# Patient Record
Sex: Male | Born: 1955
Health system: Southern US, Community
[De-identification: ages and names within clinical notes are randomized; demographics above are authoritative.]

## PROBLEM LIST (undated history)

## (undated) DIAGNOSIS — K219 Gastro-esophageal reflux disease without esophagitis: Secondary | ICD-10-CM

## (undated) DIAGNOSIS — I10 Essential (primary) hypertension: Secondary | ICD-10-CM

## (undated) DIAGNOSIS — J439 Emphysema, unspecified: Secondary | ICD-10-CM

## (undated) DIAGNOSIS — R0602 Shortness of breath: Secondary | ICD-10-CM

## (undated) HISTORY — PX: TOOTH EXTRACTION: SUR596

## (undated) HISTORY — DX: Gastro-esophageal reflux disease without esophagitis: K21.9

## (undated) HISTORY — DX: Emphysema, unspecified: J43.9

---

## 1999-04-18 HISTORY — PX: INGUINAL HERNIA REPAIR: SUR1180

## 2005-04-08 ENCOUNTER — Encounter: Admission: RE | Admit: 2005-04-08 | Discharge: 2005-04-08 | Payer: Self-pay | Admitting: General Surgery

## 2005-04-09 ENCOUNTER — Ambulatory Visit (HOSPITAL_COMMUNITY): Admission: RE | Admit: 2005-04-09 | Discharge: 2005-04-09 | Payer: Self-pay | Admitting: General Surgery

## 2005-04-09 ENCOUNTER — Ambulatory Visit (HOSPITAL_BASED_OUTPATIENT_CLINIC_OR_DEPARTMENT_OTHER): Admission: RE | Admit: 2005-04-09 | Discharge: 2005-04-09 | Payer: Self-pay | Admitting: General Surgery

## 2006-09-19 ENCOUNTER — Emergency Department (HOSPITAL_COMMUNITY): Admission: EM | Admit: 2006-09-19 | Discharge: 2006-09-19 | Payer: Self-pay | Admitting: Emergency Medicine

## 2007-06-06 ENCOUNTER — Ambulatory Visit: Payer: Self-pay | Admitting: Critical Care Medicine

## 2007-06-06 ENCOUNTER — Inpatient Hospital Stay (HOSPITAL_COMMUNITY): Admission: EM | Admit: 2007-06-06 | Discharge: 2007-06-09 | Payer: Self-pay | Admitting: Emergency Medicine

## 2007-06-08 ENCOUNTER — Encounter: Payer: Self-pay | Admitting: Pulmonary Disease

## 2007-06-26 ENCOUNTER — Emergency Department (HOSPITAL_COMMUNITY): Admission: EM | Admit: 2007-06-26 | Discharge: 2007-06-26 | Payer: Self-pay | Admitting: Family Medicine

## 2007-07-20 ENCOUNTER — Ambulatory Visit: Payer: Self-pay | Admitting: Pulmonary Disease

## 2007-08-23 DIAGNOSIS — J45909 Unspecified asthma, uncomplicated: Secondary | ICD-10-CM | POA: Insufficient documentation

## 2007-08-23 DIAGNOSIS — R05 Cough: Secondary | ICD-10-CM | POA: Insufficient documentation

## 2007-08-23 DIAGNOSIS — K219 Gastro-esophageal reflux disease without esophagitis: Secondary | ICD-10-CM

## 2007-08-23 DIAGNOSIS — R0602 Shortness of breath: Secondary | ICD-10-CM | POA: Insufficient documentation

## 2007-08-23 DIAGNOSIS — J209 Acute bronchitis, unspecified: Secondary | ICD-10-CM

## 2007-08-23 DIAGNOSIS — R059 Cough, unspecified: Secondary | ICD-10-CM | POA: Insufficient documentation

## 2007-10-25 ENCOUNTER — Ambulatory Visit: Payer: Self-pay | Admitting: Pulmonary Disease

## 2007-10-25 DIAGNOSIS — J449 Chronic obstructive pulmonary disease, unspecified: Secondary | ICD-10-CM

## 2007-10-25 DIAGNOSIS — R599 Enlarged lymph nodes, unspecified: Secondary | ICD-10-CM | POA: Insufficient documentation

## 2007-11-24 ENCOUNTER — Ambulatory Visit: Payer: Self-pay | Admitting: Cardiovascular Disease

## 2008-04-26 ENCOUNTER — Ambulatory Visit: Payer: Self-pay | Admitting: Pulmonary Disease

## 2009-04-26 ENCOUNTER — Ambulatory Visit: Payer: Self-pay | Admitting: Pulmonary Disease

## 2010-04-24 ENCOUNTER — Ambulatory Visit: Payer: Self-pay | Admitting: Pulmonary Disease

## 2010-04-24 DIAGNOSIS — R0609 Other forms of dyspnea: Secondary | ICD-10-CM

## 2010-04-24 DIAGNOSIS — R0989 Other specified symptoms and signs involving the circulatory and respiratory systems: Secondary | ICD-10-CM | POA: Insufficient documentation

## 2010-09-18 NOTE — Assessment & Plan Note (Signed)
Summary: rov for copd   Primary Provider/Referring Provider:  Silvestre Moment  CC:  1 year follow up on COPD, Breathing is doing good, SOB with extreme activity but not any worse than usual, and Pt has no complaints today. Pt would like to discuss about having a sleep study done.  History of Present Illness: the pt comes in today for f/u of his known copd.  He has been compliant with meds, and reports no breathing issues since his last visit.  He is staying active, and has had no cough, congestion, or mucus.  He is asking today about snoring, and possibly having a sleep study.  He denies nonrestorative sleep, and sleep pressure during the day with periods of inactivity.  Preventive Screening-Counseling & Management  Alcohol-Tobacco     Smoking Status: quit     Packs/Day: 1.0     Year Started: 1964     Year Quit: 2008  Current Medications (verified): 1)  Fish Oil .... (Unknown Dosage) Take 1 Tablet By Mouth Once A Day 2)  Multivitamins   Caps (Multiple Vitamin) .... Take 1 Tablet By Mouth Once A Day 3)  Bayer Low Strength 81 Mg  Tbec (Aspirin) .... Take 1 Tablet By Mouth Once A Day 4)  Advair Diskus 250-50 Mcg/dose  Misc (Fluticasone-Salmeterol) .... Inhale 1 Puff Two Times A Day 5)  Nexium 40 Mg  Cpdr (Esomeprazole Magnesium) .... Take 1 Tablet By Mouth Once A Day 6)  Proair Hfa 108 (90 Base) Mcg/act  Aers (Albuterol Sulfate) .... 2 Puffs Every 4-6 Hours As Needed 7)  Lisinopril 20 Mg Tabs (Lisinopril) .... One Tab Once Daily  Allergies (verified): No Known Drug Allergies  Social History: Patient states former smoker.  Started 1964. qut 2008 1 ppdPacks/Day:  1.0  Review of Systems       The patient complains of shortness of breath with activity and weight change.  The patient denies shortness of breath at rest, productive cough, non-productive cough, coughing up blood, chest pain, irregular heartbeats, acid heartburn, indigestion, loss of appetite, abdominal pain, difficulty  swallowing, sore throat, tooth/dental problems, headaches, nasal congestion/difficulty breathing through nose, sneezing, itching, ear ache, anxiety, depression, hand/feet swelling, joint stiffness or pain, rash, change in color of mucus, and fever.    Vital Signs:  Patient profile:   55 year old male Height:      68 inches Weight:      197.38 pounds BMI:     30.12 O2 Sat:      97 % on Room air Temp:     98.7 degrees F oral Pulse rate:   76 / minute BP sitting:   132 / 82  (right arm) Cuff size:   regular  Vitals Entered By: Carver Fila (April 24, 2010 8:56 AM)  O2 Flow:  Room air CC: 1 year follow up on COPD, Breathing is doing good, SOB with extreme activity but not any worse than usual, Pt has no complaints today. Pt would like to discuss about having a sleep study done Comments meds and allergies updated Phone number updated Mindy Edward Jolly  April 24, 2010 8:53 AM 1    Physical Exam  General:  wd male in nad Nose:  narrowed bilat, but not obstructed Lungs:  totally clear to auscultation Heart:  rrr, no mrg Extremities:  no edema or cyanosis  Neurologic:  alert and oriented, moves all 4.   Impression & Recommendations:  Problem # 1:  COPD (ICD-496) the pt is doing  well on his current bronchodilator regimen, and is satisfied with his exertional tolerance.  I have asked him to continue with current meds.  Problem # 2:  SNORING (ICD-786.09) the pt denies nonrestorative sleep and daytime sleep pressure.  I have asked him to work on weight loss and positional therapy  Medications Added to Medication List This Visit: 1)  Lisinopril 20 Mg Tabs (Lisinopril) .... One tab once daily  Other Orders: Est. Patient Level III (16109)  Patient Instructions: 1)  no change in pulmonary medications 2)  work on weight loss and staying off back while sleeping 3)  if you are not sleeping well, or if you are noticing fatigue and sleepiness, let me know. 4)  followup with me in one  year.   Prescriptions: PROAIR HFA 108 (90 BASE) MCG/ACT  AERS (ALBUTEROL SULFATE) 2 puffs every 4-6 hours as needed  #1 x 11   Entered and Authorized by:   Barbaraann Share MD   Signed by:   Barbaraann Share MD on 04/24/2010   Method used:   Print then Give to Patient   RxID:   [id number] NEXIUM 40 MG  CPDR (ESOMEPRAZOLE MAGNESIUM) Take 1 tablet by mouth once a day  #30 x 11   Entered and Authorized by:   Barbaraann Share MD   Signed by:   Barbaraann Share MD on 04/24/2010   Method used:   Print then Give to Patient   RxID:   [id number] ADVAIR DISKUS 250-50 MCG/DOSE  MISC (FLUTICASONE-SALMETEROL) Inhale 1 puff two times a day  #1 x 11   Entered and Authorized by:   Barbaraann Share MD   Signed by:   Barbaraann Share MD on 04/24/2010   Method used:   Print then Give to Patient   RxID:   [id number]    Immunization History:  Influenza Immunization History:    Influenza:  historical (05/17/2009)  Pneumovax Immunization History:    Pneumovax:  historical (05/17/2008)

## 2010-09-18 NOTE — Consult Note (Signed)
Summary: [facility]   Imported By: Sherian Rein 04/24/2010 11:49:43  _____________________________________________________________________  External Attachment:    Type:   Image     Comment:   External Document

## 2010-12-30 NOTE — H&P (Signed)
[name], SCHROEPFER NO.:  [id number]   MEDICAL RECORD NO.:  [id number]          PATIENT TYPE:  INP   LOCATION:  2110                         FACILITY:  [facility]   PHYSICIAN:  Michiel Cowboy, MDDATE OF BIRTH:  09-03-1955   DATE OF ADMISSION:  06/06/2007  DATE OF DISCHARGE:                              HISTORY & PHYSICAL   PRIMARY CARE PHYSICIAN:  Dr. Lenise Arena.   CHIEF COMPLAINT:  Severe shortness of breath.   HISTORY OF PRESENT ILLNESS:  This is a 55 year old gentleman with no  known past medical history, who for the past one week has been having  severe shortness of breath.   About one week ago, the patient noted that with exertion, he felt some  shortness of breath.  The patient went to see his primary care  physician, who ordered a chest x-ray, which was normal.  Thereafter, the  patient continued to have shortness of breath, which progressed to where  he could barely walk across the room without getting severely short of  breath.  This had been happening for the past 1 to 2 weeks.  Of note, he  had an airplane ride about 1 month ago, but denies any swelling of his  lower extremities.  He does report now that he is having chest pain on  occasional deep inspiration in the front of his chest that has been  going on for the past few days.  The patient today was again evaluated  at urgent care for severe shortness of breath, but they noted that he  was very hypoxic, saturating low 80s on room air.  The patient was  placed on oxygen, given Decadron IM, and sent to [facility] Emergency  Department.  A chest x-ray at that time showed vague questionable right  lower lobe infiltrate, which by further evaluation of radiology at [facility] was not confirmed.   ALLERGIES:  No known drug allergies.   MEDICATIONS:  Prevacid.   PAST MEDICAL HISTORY:  Only significant for remote questionable asthma  as a very young child.   SOCIAL HISTORY:  The patient is married.   He has a 30-pack-year smoking  history.  Past alcohol abuse; not currently.  Absolutely denies any  drugs.  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  Does not own pets.  Does not own  birds.  Has no exposure to aerosolized chemicals per him.   FAMILY HISTORY:  Significant for coronary artery disease in father.  No  early MIs.  No lung disease.   REVIEW OF SYSTEMS:  No fevers, no chills, no night sweats, no weight  loss, no headache.  No upper respiratory abnormalities.  The patient  does report severe shortness of breath and dyspnea on exertion.  No PND  or edema.  No palpitations or syncope.  No urinary troubles.  No nausea,  vomiting or diarrhea.  Otherwise, review of systems is negative.   PHYSICAL EXAMINATION:  VITAL SIGNS:  Temperature 98.4, pulse 94,  respirations 22, blood pressure 137/90, sats 92% on 5 liters.  GENERAL:  The patient appears to be in  no acute distress, breathing  regularly, but somewhat anxious.  HEENT:  PERRLA/EOMI and moist mucous membranes.  NECK:  No lymphadenopathy noted.  HEART:  Rapid, but regular.  No murmurs noted.  LUNGS:  Wheezing bilaterally.  No crackles.  SKIN:  No rash.  ABDOMEN:  Soft, nontender, nondistended.  EXTREMITIES:  No clubbing, cyanosis or edema.  Joints are within normal  limits.  NEUROLOGICAL:  Nonfocal.   RADIOLOGICAL STUDIES:  Shows chest x-ray with right lower lobe pneumonia  per outside read, but not confirmed by the read of [facility].  I have personally reviewed the x-ray, as well as had a radiologist read  it here.   LABORATORY DATA:  White blood cell count elevated to 15.1, 93%  neutrophils, 1% eosinophils, only 4% lymphocytes.  H&H 17.1 and 49.8,  platelets 210.  Sodium 138, bicarb 25, potassium 4.4, creatinine 0.8.  Gas ABG 7.426, 34 and 60, sats 91%, and that was on I believe 5 liters.   ASSESSMENT AND PLAN:  This is a 55 year old gentleman with very severe  hypoxia.  Etiology includes potential chronic obstructive  pulmonary  disease versus asthma versus pulmonary embolism.  1. Chronic obstructive pulmonary disease versus asthma versus reactive      airway disease:  We will give the patient Solu-Medrol intravenous.      Start him on Advair, Atrovent and albuterol nebulizers q.2 hours,      as well as scheduled Atrovent nebulizers q.8 hours.  We will given      him antibiotics, Avelox, and obtain alpha-1-antitrypsin levels, as      well as Human immunodeficiency virus.  We will obtain CAT scan with      contrast to rule out pulmonary embolism, as well as to assess      parenchyma, given the degree of hypoxia and 30-pack-year smoking      history, which may now be severe enough to explain the degree of      hypoxia.  We will place the patient in step-down and watch      carefully.  The patient does not have risk factors for Human      immunodeficiency virus.  We will consider pulmonary consult in the      morning if no improvement.  2. Chest pain, pleuritic in nature:  We will rule out for pulmonary      embolism, but also given his severe shortness of breath, we will      cycle cardiac enzymes and obtain EKG now and in the morning.  3. Prophylaxis:  Lovenox and Protonix.      Michiel Cowboy, MD  Electronically Signed     AVD/MEDQ  D:  06/06/2007  T:  06/07/2007  Job:  [id number]   cc:   Joycelyn Rua, M.D.

## 2010-12-30 NOTE — Discharge Summary (Signed)
[name], [name]                ACCOUNT NO.:  [id number]   MEDICAL RECORD NO.:  [id number]          PATIENT TYPE:  INP   LOCATION:  5506                         FACILITY:  [facility]   PHYSICIAN:  Michelene Gardener, MD    DATE OF BIRTH:  Dec 30, 1955   DATE OF ADMISSION:  06/06/2007  DATE OF DISCHARGE:  06/09/2007                               DISCHARGE SUMMARY   PRIMARY CARE PHYSICIAN:  Dr. Joycelyn Rua.   DISCHARGE DIAGNOSES:  1. Chronic obstructive pulmonary disease.  2. Hypoxemia.  3. Gastroesophageal reflux disease.  4. Leukocytosis.  5. Tobacco abuse.   DISCHARGE MEDICATIONS:  1. Albuterol 2 puffs q.4 h. as needed.  2. Advair 250/50 1 puff twice daily.  3. Avelox 400 mg p.o. once daily x5 days.  4. Prednisone tapering dose.  5. Prevacid 30 mg once a day.  6. Multivitamin one tab p.o. once daily.  7. Fish oil once a day.   CONSULTATIONS:  Pulmonary consult done on October 21.   PROCEDURES:  Pulmonary function test.  Showed evidence of his COPD.   RADIOLOGY STUDIES:  CT scan of the chest showed diffuse peribronchial  thickening compatible with bronchitis with underlying emphysematous  changes.  There is no evidence of pulmonary embolism.  There is a single  nonspecific minimally prominent right hilar lymph node.   COURSE OF HOSPITALIZATION:  This is a 55 year old male who is a heavy  smoker presented to the hospital on October 20 complaining of increasing  shortness of breath.  The patient was admitted to the hospital for  evaluation of his possible COPD.  Was started on albuterol nebulizer,  Atrovent, and Proventil nebulizers, and was put on Advair.  He was also  given Avelox IV in addition to Solu-Medrol IV.  The patient has been on  oxygen during the hospitalization as well.  Pulmonary evaluation was  done, and the patient was taken for pulmonary function test that showed  evidence of COPD.  The patient improved quick.  At the time of discharge  he was given  prednisone in tapering dose and antibiotics to be continued  for 5 more days.  He also was advised to take albuterol and Advair as  instructed.  The patient was advised to follow with [facility] in  three months for possible repeat of his  CAT scan.  He was also advised to quit smoking and to follow with his  primary physician next week.  Otherwise, other medical conditions  remained stable during the hospitalization.   Assessment time is 40 minutes.      Michelene Gardener, MD  Electronically Signed     NAE/MEDQ  D:  06/09/2007  T:  06/10/2007  Job:  [id number]   cc:   Joycelyn Rua, M.D.

## 2010-12-30 NOTE — Consult Note (Signed)
NAMELYSLE, YERO NO.:  [id number]   MEDICAL RECORD NO.:  [id number]          PATIENT TYPE:  INP   LOCATION:  2110                         FACILITY:  [facility]   PHYSICIAN:  Felipa Evener, MD  DATE OF BIRTH:  07-May-1956   DATE OF CONSULTATION:  DATE OF DISCHARGE:                                 CONSULTATION   IDENTIFYING DATA:  Patient is a 50-year male smoker with 35-pack year  history of smoking, presenting to the hospital with shortness of breath.  Patient reports the shortness of breath started approximately in January  2008, 9 months prior to presentation, for which he reported to urgent  care and was diagnosed with pneumonia, given amoxicillin.  At the time,  patient was not admitted to the hospital.  He felt better approximately  2 after amoxicillin was started and he was given also p.r.n. Albuterol  and sent home.  Patient felt well for approximately 8 months; however,  was noted back to baseline until 5 days ago, when he was barbecuing and  started noting a significant amount of shortness of breath.  The  shortness of breath continued to increase until patient brought to the  hospital on June 06, 2007 with increased dyspnea and exertion.  He  reports some cough in the morning that is productive of off white to  gray sputum with no evidence of purulence.  No fever, chills, nausea,  abdominal pain, or chest pain, no headaches, visual changes, any rashes.  He denied any leg edema.  Also denies any hemoptysis, any weight loss,  or weight gain.  He denies any occupational or environmental exposure  prior to the onset of events; however, he did report that he has had  pneumonia and asthma problems as a child as well as frequent colds for  the past few years.   PAST MEDICAL HISTORY:  Significant for history of asthma as a young  child that has been resolved and gastroesophageal reflux disease.   HOME MEDICATIONS:  1. Fish oil.  2. Prevacid.  3.  MVI.  4. Baby aspirin.   FAMILY HISTORY:  Significant for coronary artery disease.  He new of no  history of cystic fibrosis or any early pulmonary disease even in  smokers.   SOCIAL HISTORY:  He smokes 1 pack per day for 35 years.  No alcohol and  drug abuse.  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   REVIEW OF SYSTEMS:  A 12-point review of system was performed.  It was  negative for other than mentioned in the history of present illness.   PHYSICAL EXAMINATION:  GENERAL:  This is a well-appearing male, sitting  up in his bed in no acute distress.  Awake, alert and oriented x3.  VITAL SIGNS:  Temperature is 98.3, heart rate 90-100, respiratory rate  10-20, blood pressure is 106/46 to 152/94 earlier. O2 saturation is 93%  with 3 liters nasal cannula.  HEENT:  Normocephalic, atraumatic.  Pupils equal, round and reactive to  light.  Extraocular  movements intact.  Oral mucosa is within normal  limits.  NECK:  No thyromegaly, lymphadenopathy.  No JVD appreciated.  HEART:  Regular rate and rhythm.  Normal S1 and S2.  No murmurs, rubs or  gallops appreciated.  LUNGS:  With diffuse expiratory wheezes in all lung fields.  Bibasilar  crackles.  ABDOMEN:  Soft, nontender, nondistended.  Positive bowel sounds.  EXTREMITIES:  No edema and no clubbing appreciated.  NEUROLOGIC:  Grossly intact.   LABORATORY STUDIES:  Significant for sodium of 139, potassium 3.9,  chloride 105, bicarb 23, BUN 14, creatinine 0.94, blood sugar 203,  calcium 95, LFTs are normal on admission.  Cardiac enzymes are negative.  White blood cell count 13.3, down from 15.1 on admission.  Hemoglobin  15.9, down from 17.7 on admission, platelets 215.  Coags are normal.  ABG with pH of 7.46, CO2 35, O2 62, and bicarb of 25.  CT angiogram was  done which revealed no significant PE.  There is diffuse peribronchial  cuffing consistent with  bronchitis and bronchiectasis.  Mild  emphysematous changes with no masses.  There is right hilar  lymphadenopathy with mild aneurysmal dilatation of the ascending  thoracic aorta with no evidence of dissection.  Chest x-ray revealed  mild bronchiectatic changes that appeared to be chronic.   ASSESSMENT/PLAN:  Patient is a 55 year old male with past medical  history of smoking and COPD with chronic bronchitis and evidence of  bronchiectasis.  There is also some right-sided right hilar  lymphadenopathy evident with no pulmonary mass.  Therefore, we recommend  starting Advair 250/50 one puff b.i.d.  Will start with Combivent 2  puffs q.i.d. while awake, p.r.n. albuterol as ordered.  We will order  __________ desaturate to assess the patient's need for oxygen.  We will  order full pulmonary function test with pre- and post to evaluate the  extent of the patient's pulmonary disease.  Smoking cessation was done  by me.  We will order Avelox as the appropriate antibiotic at this time,  60 mg p.o. daily for 7 days.  I would recommend a CT of the chest with  contrast in 3 months to evaluate with the lymphadenopathy.  Patient will  definitely need an outpatient pulmonary appointment for followup.  Thank  you for allowing me to participate in this patient's care.      Felipa Evener, MD  Electronically Signed     WJY/MEDQ  D:  06/07/2007  T:  06/08/2007  Job:  [id number]

## 2011-01-02 NOTE — Op Note (Signed)
NAMEBURLEIGH, BROCKMANN                ACCOUNT NO.:  [id number]   MEDICAL RECORD NO.:  [id number]          PATIENT TYPE:  AMB   LOCATION:  DSC                          FACILITY:  [facility]   PHYSICIAN:  Cherylynn Ridges, M.D.    DATE OF BIRTH:  1955/09/21   DATE OF PROCEDURE:  04/09/2005  DATE OF DISCHARGE:                                 OPERATIVE REPORT   PREOPERATIVE DIAGNOSIS:  Right inguinal hernia.   POSTOPERATIVE DIAGNOSIS:  Large indirect right inguinal hernia   PROCEDURE:  Right inguinal hernia repair with mesh.   SURGEON:  Cherylynn Ridges, M.D.   ANESTHESIA:  General endotracheal.   ESTIMATED BLOOD LOSS:  Less than 30 cc.   COMPLICATIONS:  None.   CONDITION:  Stable   INDICATIONS FOR OPERATION:  The patient is a 55 year old with a symptomatic  right inguinal hernia who comes in now for repair.   FINDINGS:  The patient had a large indirect hernia with entrapped omental or  fatty tissue which were able to reduce into the peritoneal cavity.  The  omentum was stuck in the sac and had to be dissect away from the wall prior  to closure.   DESCRIPTION OF OPERATION:  The patient was taken to the operating room,  placed on the table in supine position. After an adequate general laryngeal  airway anesthetic was administered, he was  prepped and draped in the usual  sterile manner exposing the right lower quadrant.   A right lower quadrant transverse curvilinear incision was made at the level  of superficial ring was taken down through the subcutaneous tissue using  electrocautery down to the external oblique fascia. We were able to split  the fascia along its fibers through the superficial ring and then  subsequently dissect out the spermatic cord with the a large hernia sac with  it that the pubic tubercle.  We placed a Penrose drain around the hernia sac  and then we were able to dissect away the large hernia sac from the  spermatic cord with minimal difficulty.  There was some  cremasteric muscle  attached to the sac along with lipoma the structures, however, once we  opened the site we could see that it contained a large amount of omentum  which we were able to dissect away from the wall, pass back into the  peritoneal cavity and subsequently ligate the sac at its neck at the  internal ring after choosing and using 2-0 Ethibond suture ligatures.  Once  this was done, we resected the excess sac and allowed the sac to retract  underneath the abdominal wall muscles.  We then reinforced the floor using a  right running O Prolene suture x2 attached to the Marlex mesh measuring 5 x  3 cm incised, attaching it anterior medially in a  continuous running  fashion to the conjoined tendon and inferolaterally to the reflected portion  of inguinal ligament.  We irrigated with it and soaked in antibiotic  solution.  Once this was closed and up to recreating the internal ring, we  closed the external  oblique fascia on top of the cord using a running 3-0  Vicryl suture.  Once this was done, Scarpa's fascia was reapproximated using  three interrupted stitches of 3-0 Vicryl, and then the skin was closed using  a running subcuticular stitch of 4-0 Monocryl.  We injected with 0.5%  Marcaine without epinephrine prior to closure.  All needle, sponge counts  and instrument counts were correct. A sterile dressing was applied.      Cherylynn Ridges, M.D.  Electronically Signed     JOW/MEDQ  D:  04/09/2005  T:  04/10/2005  Job:  [id number]

## 2011-05-04 ENCOUNTER — Ambulatory Visit: Payer: Self-pay | Admitting: Pulmonary Disease

## 2011-05-13 ENCOUNTER — Encounter: Payer: Self-pay | Admitting: Pulmonary Disease

## 2011-05-18 ENCOUNTER — Ambulatory Visit (INDEPENDENT_AMBULATORY_CARE_PROVIDER_SITE_OTHER): Payer: BC Managed Care – PPO | Admitting: Pulmonary Disease

## 2011-05-18 ENCOUNTER — Encounter: Payer: Self-pay | Admitting: Pulmonary Disease

## 2011-05-18 VITALS — BP 112/70 | HR 88 | Temp 98.6°F | Ht 68.0 in | Wt 203.0 lb

## 2011-05-18 DIAGNOSIS — J449 Chronic obstructive pulmonary disease, unspecified: Secondary | ICD-10-CM

## 2011-05-18 DIAGNOSIS — Z23 Encounter for immunization: Secondary | ICD-10-CM

## 2011-05-18 NOTE — Patient Instructions (Signed)
Continue on current meds. Stay active, work on weight loss followup with me in one year, but call if having breathing issues.

## 2011-05-18 NOTE — Assessment & Plan Note (Signed)
The patient is doing fairly well from a COPD standpoint.  He has been compliant on his medications, and is staying active.  He has not had an acute exacerbation or required excessive rescue inhaler use.  I've asked him to continue with his current meds, and to followup with me in one year.  We will give him the flu shot today.

## 2011-05-18 NOTE — Progress Notes (Signed)
  Subjective:    Patient ID: [name], male    DOB: 1956/05/10, 55 y.o.   MRN: [id number]  HPI The patient comes in today for followup of his known COPD.  He is been compliant with his bronchodilator regimen, and feels that he is doing very well.  He is staying active, and has not had an acute exacerbation or need to use his rescue inhaler frequently.  He denies any significant cough, chest congestion, or purulent mucus.   Review of Systems  Constitutional: Negative for fever and unexpected weight change.  HENT: Negative for ear pain, nosebleeds, congestion, sore throat, rhinorrhea, sneezing, trouble swallowing, dental problem, postnasal drip and sinus pressure.   Eyes: Negative for redness and itching.  Respiratory: Negative for cough, chest tightness, shortness of breath and wheezing.   Cardiovascular: Negative for palpitations and leg swelling.  Gastrointestinal: Negative for nausea and vomiting.  Genitourinary: Negative for dysuria.  Musculoskeletal: Negative for joint swelling.  Skin: Negative for rash.  Neurological: Negative for headaches.  Hematological: Does not bruise/bleed easily.  Psychiatric/Behavioral: Negative for dysphoric mood. The patient is not nervous/anxious.        Objective:   Physical Exam Well-developed male in no acute distress Nose without purulence or discharge noted Chest totally clear to auscultation Cardiac with regular rate and rhythm, no MRG Lower extremities without edema, no cyanosis noted Alert and oriented, moves all 4 extremities.       Assessment & Plan:

## 2011-05-18 NOTE — Progress Notes (Signed)
Addended by: Salli Quarry on: 05/18/2011 11:09 AM   Modules accepted: Orders

## 2011-05-27 LAB — CARDIAC PANEL(CRET KIN+CKTOT+MB+TROPI)
Relative Index: INVALID
Relative Index: INVALID
Relative Index: INVALID
Total CK: 37
Troponin I: 0.01

## 2011-05-27 LAB — POCT I-STAT 3, ART BLOOD GAS (G3+)
Bicarbonate: 22.4
O2 Saturation: 91
Operator id: 277341
Patient temperature: 37
TCO2: 23
pCO2 arterial: 35.3
pH, Arterial: 7.426
pH, Arterial: 7.456 — ABNORMAL HIGH

## 2011-05-27 LAB — COMPREHENSIVE METABOLIC PANEL
ALT: 19
AST: 12
Alkaline Phosphatase: 56
CO2: 25
CO2: 26
Chloride: 105
Creatinine, Ser: 0.8
GFR calc Af Amer: >60
GFR calc Af Amer: >60
GFR calc non Af Amer: >60
Potassium: 4.1
Potassium: 4.2
Sodium: 137
Total Bilirubin: 0.6

## 2011-05-27 LAB — CBC
Hemoglobin: 15.9
MCHC: 34.2
MCV: 93.7
Platelets: 196
Platelets: 210
Platelets: 215
RBC: 4.82
RBC: 4.97
RBC: 5.38
RDW: 12.3
RDW: 12.4
WBC: 18.4 — ABNORMAL HIGH

## 2011-05-27 LAB — CK TOTAL AND CKMB (NOT AT ARMC)
Relative Index: INVALID
Total CK: 44

## 2011-05-27 LAB — I-STAT 8, (EC8 V) (CONVERTED LAB)
Bicarbonate: 24.5 — ABNORMAL HIGH
Chloride: 106
Glucose, Bld: 119 — ABNORMAL HIGH
HCT: 52
Sodium: 138

## 2011-05-27 LAB — BASIC METABOLIC PANEL
CO2: 23
Chloride: 105
Creatinine, Ser: 0.94
GFR calc Af Amer: >60
Glucose, Bld: 203 — ABNORMAL HIGH
Sodium: 139

## 2011-05-27 LAB — HIV ANTIBODY (ROUTINE TESTING W REFLEX): HIV: NONREACTIVE

## 2011-05-27 LAB — PROTIME-INR: INR: 0.9

## 2011-05-27 LAB — POCT I-STAT CREATININE: Operator id: 151321

## 2011-05-27 LAB — DIFFERENTIAL
Eosinophils Relative: 1
Monocytes Relative: 2 — ABNORMAL LOW
Neutro Abs: 14.1 — ABNORMAL HIGH
Neutrophils Relative %: 93 — ABNORMAL HIGH

## 2011-05-27 LAB — ALPHA-1-ANTITRYPSIN: A-1 Antitrypsin, Ser: 156

## 2011-05-28 ENCOUNTER — Other Ambulatory Visit: Payer: Self-pay | Admitting: Pulmonary Disease

## 2012-05-23 ENCOUNTER — Ambulatory Visit (INDEPENDENT_AMBULATORY_CARE_PROVIDER_SITE_OTHER): Payer: BC Managed Care – PPO | Admitting: Pulmonary Disease

## 2012-05-23 ENCOUNTER — Encounter: Payer: Self-pay | Admitting: Pulmonary Disease

## 2012-05-23 VITALS — BP 138/98 | HR 88 | Temp 98.6°F | Ht 68.0 in | Wt 206.6 lb

## 2012-05-23 DIAGNOSIS — Z23 Encounter for immunization: Secondary | ICD-10-CM

## 2012-05-23 DIAGNOSIS — J449 Chronic obstructive pulmonary disease, unspecified: Secondary | ICD-10-CM

## 2012-05-23 NOTE — Progress Notes (Signed)
  Subjective:    Patient ID: [name], male    DOB: [birth year], 56 y.o.   MRN: [id number]  HPI Patient comes in today for followup of his known COPD, secondary to asthma and emphysema.  He has done very well on his current regimen, and has not had a pulmonary infection or acute exacerbation since the last visit.  He rarely uses his rescue inhaler.   Review of Systems  Constitutional: Negative for fever and unexpected weight change.  HENT: Positive for rhinorrhea. Negative for ear pain, nosebleeds, congestion, sore throat, sneezing, trouble swallowing, dental problem, postnasal drip and sinus pressure.   Eyes: Negative for redness and itching.  Respiratory: Negative for cough, chest tightness, shortness of breath and wheezing.   Cardiovascular: Negative for palpitations and leg swelling.  Gastrointestinal: Negative for nausea and vomiting.  Genitourinary: Negative for dysuria.  Musculoskeletal: Negative for joint swelling.  Skin: Negative for rash.  Neurological: Negative for headaches.  Hematological: Does not bruise/bleed easily.  Psychiatric/Behavioral: Negative for dysphoric mood. The patient is not nervous/anxious.        Objective:   Physical Exam Overweight male in no acute distress Nose without purulence or discharge noted Neck without lymphadenopathy or thyromegaly Chest with totally clear breath sounds, no wheezing Cardiac exam with regular rate and rhythm Lower extremities without edema, no cyanosis Alert and oriented, moves all 4 extremities.       Assessment & Plan:

## 2012-05-23 NOTE — Assessment & Plan Note (Signed)
The patient is doing well from a COPD standpoint on his current regimen.  I've asked him to stay on his bronchodilator regimen, and also to work on weight loss and some type of exercise program.  Maryclare Labrador give him the flu shot today.

## 2012-05-23 NOTE — Patient Instructions (Addendum)
No change in medications Stay active, work on weight loss Will give you the flu shot today. followup with me in one year.

## 2012-06-18 ENCOUNTER — Other Ambulatory Visit: Payer: Self-pay | Admitting: Pulmonary Disease

## 2012-06-20 ENCOUNTER — Other Ambulatory Visit: Payer: Self-pay | Admitting: Pulmonary Disease

## 2013-07-17 ENCOUNTER — Other Ambulatory Visit: Payer: Self-pay | Admitting: Pulmonary Disease

## 2013-08-07 ENCOUNTER — Ambulatory Visit (INDEPENDENT_AMBULATORY_CARE_PROVIDER_SITE_OTHER): Payer: BC Managed Care – PPO | Admitting: Pulmonary Disease

## 2013-08-07 ENCOUNTER — Encounter: Payer: Self-pay | Admitting: Pulmonary Disease

## 2013-08-07 VITALS — BP 128/88 | HR 83 | Temp 98.1°F | Ht 68.0 in | Wt 221.2 lb

## 2013-08-07 DIAGNOSIS — Z23 Encounter for immunization: Secondary | ICD-10-CM

## 2013-08-07 DIAGNOSIS — J449 Chronic obstructive pulmonary disease, unspecified: Secondary | ICD-10-CM

## 2013-08-07 MED ORDER — FLUTICASONE-SALMETEROL 250-50 MCG/DOSE IN AEPB: 1.0000 | INHALATION_SPRAY | Freq: Two times a day (BID) | RESPIRATORY_TRACT | Status: DC

## 2013-08-07 NOTE — Progress Notes (Signed)
   Subjective:    Patient ID: [name], male    DOB: September 05, 1955, 57 y.o.   MRN: [id number]  HPI Patient comes in today for followup of his known COPD. He has not had an acute exacerbation or required his rescue inhaler since the last visit. He is staying active, and is satisfied with his exertional tolerance. Of note, his weight has increased significantly since the last visit   Review of Systems  Constitutional: Negative for fever and unexpected weight change.  HENT: Negative for congestion, dental problem, ear pain, nosebleeds, postnasal drip, rhinorrhea, sinus pressure, sneezing, sore throat and trouble swallowing.   Eyes: Negative for redness and itching.  Respiratory: Negative for cough, chest tightness, shortness of breath and wheezing.   Cardiovascular: Negative for palpitations and leg swelling.  Gastrointestinal: Negative for nausea and vomiting.  Genitourinary: Negative for dysuria.  Musculoskeletal: Negative for joint swelling.  Skin: Negative for rash.  Neurological: Negative for headaches.  Hematological: Does not bruise/bleed easily.  Psychiatric/Behavioral: Negative for dysphoric mood. The patient is not nervous/anxious.        Objective:   Physical Exam Overweight male in no acute distress Nose without purulence or discharge noted Neck without lymphadenopathy or thyromegaly Chest with totally clear breath sounds, no wheezing Cardiac exam with regular rate and rhythm Lower extremities without edema, no cyanosis Alert and oriented, moves all 4 extremities.       Assessment & Plan:

## 2013-08-07 NOTE — Assessment & Plan Note (Signed)
The patient is doing very well from a COPD standpoint, and has not had an acute exacerbation since the last visit. He is satisfied with his current medical regimen, and I've encouraged him to work on weight loss and conditioning.

## 2013-08-07 NOTE — Patient Instructions (Signed)
No change in medications. Will send in a refill for your advair for a year. Will give you the flu shot today. Work on weight loss and conditioning. followup with me in one year.

## 2013-09-25 ENCOUNTER — Ambulatory Visit: Payer: BC Managed Care – PPO | Admitting: Internal Medicine

## 2013-09-26 ENCOUNTER — Encounter (INDEPENDENT_AMBULATORY_CARE_PROVIDER_SITE_OTHER): Payer: Self-pay

## 2013-09-26 ENCOUNTER — Encounter: Payer: Self-pay | Admitting: Pulmonary Disease

## 2013-09-26 ENCOUNTER — Ambulatory Visit (INDEPENDENT_AMBULATORY_CARE_PROVIDER_SITE_OTHER): Payer: BC Managed Care – PPO | Admitting: Pulmonary Disease

## 2013-09-26 VITALS — BP 154/98 | HR 93 | Temp 97.8°F | Ht 68.0 in | Wt 211.0 lb

## 2013-09-26 DIAGNOSIS — J449 Chronic obstructive pulmonary disease, unspecified: Secondary | ICD-10-CM

## 2013-09-26 DIAGNOSIS — J441 Chronic obstructive pulmonary disease with (acute) exacerbation: Secondary | ICD-10-CM | POA: Insufficient documentation

## 2013-09-26 MED ORDER — PREDNISONE 10 MG PO TABS: ORAL_TABLET | ORAL | Status: DC

## 2013-09-26 MED ORDER — METHYLPREDNISOLONE ACETATE 80 MG/ML IJ SUSP
120.0000 mg | Freq: Once | INTRAMUSCULAR | Status: AC
  Administered 2013-09-26: 120 mg via INTRAMUSCULAR

## 2013-09-26 NOTE — Assessment & Plan Note (Signed)
The patient is clearly having a COPD exacerbation, probably related to an asthma flare. We are going to give him a nebulized bronchodilator treatment in the office as well as IM Depo-Medrol. Will also start him on a prednisone taper. From his history, he does not seem to be infected, and we'll therefore hold off on antibiotics at this time. It had a long discussion with him about the need to call us if he is not improving, and to go to the emergency room immediately if he worsens. He understands that he should not stay at home and underlying on his rescue inhaler if his breathing is not improving. He is to also call us if he begins to get more congested or bringing up purulent mucus.

## 2013-09-26 NOTE — Addendum Note (Signed)
Addended by: Maisie FusGREEN, Adianna Darwin M on: 09/26/2013 04:59 PM   Modules accepted: Orders

## 2013-09-26 NOTE — Progress Notes (Signed)
   Subjective:    Patient ID: [name], male    DOB: 1955-12-17, 58 y.o.   MRN: [id number]  HPI The patient comes in today for an acute sick visit. He has known COPD related to asthma and emphysema, and has done well for quite some time on his maintenance bronchodilator regimen. He was in his usual state of health until January when he developed URI like symptoms, but it resolved spontaneously within a week and he returned to baseline. He [place] the [place] this [place] with moderate altitude, and began to develop increasing shortness of breath and wheezing. He developed a feeling of air trapping, but does not have pressure like chest pain or radiation to arm or jaw. He has minimal cough, and does not feel congested. He is not producing mucus from his nose or chest. He has had worsening shortness of breath despite using his rescue inhaler and staying on his maintenance medications.   Review of Systems  Constitutional: Negative for fever and unexpected weight change.  HENT: Negative for congestion, dental problem, ear pain, nosebleeds, postnasal drip, rhinorrhea, sinus pressure, sneezing, sore throat and trouble swallowing.   Eyes: Negative for redness and itching.  Respiratory: Positive for cough, chest tightness, shortness of breath and wheezing.   Cardiovascular: Negative for palpitations and leg swelling.  Gastrointestinal: Negative for nausea and vomiting.  Genitourinary: Negative for dysuria.  Musculoskeletal: Negative for joint swelling.  Skin: Negative for rash.  Neurological: Negative for headaches.  Hematological: Does not bruise/bleed easily.  Psychiatric/Behavioral: Negative for dysphoric mood. The patient is not nervous/anxious.        Objective:   Physical Exam Well-developed male in mild respiratory distress with accessory muscle use. Nose without purulence or discharge noted Oropharynx clear Neck without lymphadenopathy or thyromegaly Chest with diffuse wheezing  throughout, and mildly decreased breath sounds. He clearly has mild increased work of breathing Cardiac exam with regular rate and rhythm Lower extremities without edema, no cyanosis Alert and oriented, moves all 4 extremities.       Assessment & Plan:

## 2013-09-26 NOTE — Patient Instructions (Signed)
No change in your usual maintenance meds. Will give you a steroid shot that should kick in within a few hours. Start on prednisone taper over 8 days If you are not getting better, please call us.  If you are getting worse and during the night, go to ER.

## 2013-09-26 NOTE — Addendum Note (Signed)
Addended by: Maisie FusGREEN, Anetria Harwick M on: 09/26/2013 04:28 PM   Modules accepted: Orders

## 2013-09-27 ENCOUNTER — Emergency Department (HOSPITAL_COMMUNITY): Payer: BC Managed Care – PPO

## 2013-09-27 ENCOUNTER — Inpatient Hospital Stay (HOSPITAL_COMMUNITY)
Admission: EM | Admit: 2013-09-27 | Discharge: 2013-09-30 | DRG: 192 | Disposition: A | Discharge status: Home / Self Care | Payer: BC Managed Care – PPO | Attending: Internal Medicine | Admitting: Internal Medicine

## 2013-09-27 ENCOUNTER — Encounter (HOSPITAL_COMMUNITY): Payer: Self-pay | Admitting: Emergency Medicine

## 2013-09-27 ENCOUNTER — Telehealth: Payer: Self-pay | Admitting: Pulmonary Disease

## 2013-09-27 DIAGNOSIS — Z87891 Personal history of nicotine dependence: Secondary | ICD-10-CM

## 2013-09-27 DIAGNOSIS — J449 Chronic obstructive pulmonary disease, unspecified: Secondary | ICD-10-CM

## 2013-09-27 DIAGNOSIS — R000 Tachycardia, unspecified: Secondary | ICD-10-CM | POA: Diagnosis present

## 2013-09-27 DIAGNOSIS — I10 Essential (primary) hypertension: Secondary | ICD-10-CM | POA: Diagnosis present

## 2013-09-27 DIAGNOSIS — K219 Gastro-esophageal reflux disease without esophagitis: Secondary | ICD-10-CM | POA: Diagnosis present

## 2013-09-27 DIAGNOSIS — J441 Chronic obstructive pulmonary disease with (acute) exacerbation: Principal | ICD-10-CM | POA: Diagnosis present

## 2013-09-27 HISTORY — DX: Shortness of breath: R06.02

## 2013-09-27 HISTORY — DX: Essential (primary) hypertension: I10

## 2013-09-27 LAB — CBC WITH DIFFERENTIAL/PLATELET
BASOS PCT: 0 % (ref 0–1)
Basophils Absolute: 0 10*3/uL (ref 0.0–0.1)
EOS ABS: 0.1 10*3/uL (ref 0.0–0.7)
EOS PCT: 1 % (ref 0–5)
HCT: 46 % (ref 39.0–52.0)
Hemoglobin: 16.4 g/dL (ref 13.0–17.0)
LYMPHS ABS: 1.2 10*3/uL (ref 0.7–4.0)
Lymphocytes Relative: 10 % — ABNORMAL LOW (ref 12–46)
MCH: 31.7 pg (ref 26.0–34.0)
MCHC: 35.7 g/dL (ref 30.0–36.0)
MCV: 88.8 fL (ref 78.0–100.0)
Monocytes Absolute: 0.8 10*3/uL (ref 0.1–1.0)
Monocytes Relative: 7 % (ref 3–12)
Neutro Abs: 10 10*3/uL — ABNORMAL HIGH (ref 1.7–7.7)
Neutrophils Relative %: 83 % — ABNORMAL HIGH (ref 43–77)
PLATELETS: 229 10*3/uL (ref 150–400)
RBC: 5.18 MIL/uL (ref 4.22–5.81)
RDW: 13.2 % (ref 11.5–15.5)
WBC: 12 10*3/uL — ABNORMAL HIGH (ref 4.0–10.5)

## 2013-09-27 LAB — BASIC METABOLIC PANEL
BUN: 15 mg/dL (ref 6–23)
CALCIUM: 9.7 mg/dL (ref 8.4–10.5)
CO2: 21 meq/L (ref 19–32)
CREATININE: 0.71 mg/dL (ref 0.50–1.35)
Chloride: 100 mEq/L (ref 96–112)
GFR calc Af Amer: >90 mL/min (ref >90)
GFR calc non Af Amer: >90 mL/min (ref >90)
GLUCOSE: 136 mg/dL — AB (ref 70–99)
Potassium: 4.6 mEq/L (ref 3.7–5.3)
Sodium: 141 mEq/L (ref 137–147)

## 2013-09-27 LAB — D-DIMER, QUANTITATIVE (NOT AT ARMC): D DIMER QUANT: 0.27 ug{FEU}/mL (ref 0.00–0.48)

## 2013-09-27 LAB — MAGNESIUM: MAGNESIUM: 2 mg/dL (ref 1.5–2.5)

## 2013-09-27 MED ORDER — ZOLPIDEM TARTRATE 5 MG PO TABS
5.0000 mg | ORAL_TABLET | Freq: Every evening | ORAL | Status: DC | PRN
  Administered 2013-09-27 – 2013-09-29 (×3): 5 mg via ORAL
  Filled 2013-09-27 (×3): qty 1

## 2013-09-27 MED ORDER — PANTOPRAZOLE SODIUM 40 MG PO TBEC
40.0000 mg | DELAYED_RELEASE_TABLET | Freq: Every day | ORAL | Status: DC
Start: 2013-09-27 — End: 2013-09-30
  Administered 2013-09-27 – 2013-09-30 (×4): 40 mg via ORAL
  Filled 2013-09-27 (×4): qty 1

## 2013-09-27 MED ORDER — ALBUTEROL (5 MG/ML) CONTINUOUS INHALATION SOLN
10.0000 mg/h | INHALATION_SOLUTION | RESPIRATORY_TRACT | Status: AC
  Administered 2013-09-27: 10 mg/h via RESPIRATORY_TRACT
  Filled 2013-09-27: qty 20

## 2013-09-27 MED ORDER — ONDANSETRON HCL 4 MG PO TABS: 4.0000 mg | ORAL_TABLET | Freq: Four times a day (QID) | ORAL | Status: DC | PRN

## 2013-09-27 MED ORDER — ALUM & MAG HYDROXIDE-SIMETH 200-200-20 MG/5ML PO SUSP: 30.0000 mL | Freq: Four times a day (QID) | ORAL | Status: DC | PRN

## 2013-09-27 MED ORDER — ALBUTEROL (5 MG/ML) CONTINUOUS INHALATION SOLN
10.0000 mg/h | INHALATION_SOLUTION | Freq: Once | RESPIRATORY_TRACT | Status: AC
  Administered 2013-09-27: 10 mg/h via RESPIRATORY_TRACT
  Filled 2013-09-27: qty 20

## 2013-09-27 MED ORDER — MOMETASONE FURO-FORMOTEROL FUM 100-5 MCG/ACT IN AERO
2.0000 | INHALATION_SPRAY | Freq: Two times a day (BID) | RESPIRATORY_TRACT | Status: DC
  Administered 2013-09-28 – 2013-09-30 (×4): 2 via RESPIRATORY_TRACT
  Filled 2013-09-27 (×2): qty 8.8

## 2013-09-27 MED ORDER — ASPIRIN 81 MG PO TABS: 81.0000 mg | ORAL_TABLET | Freq: Every day | ORAL | Status: DC

## 2013-09-27 MED ORDER — ENOXAPARIN SODIUM 40 MG/0.4ML ~~LOC~~ SOLN
40.0000 mg | SUBCUTANEOUS | Status: DC
  Administered 2013-09-27 – 2013-09-29 (×3): 40 mg via SUBCUTANEOUS
  Filled 2013-09-27 (×4): qty 0.4

## 2013-09-27 MED ORDER — LEVALBUTEROL HCL 0.63 MG/3ML IN NEBU
0.6300 mg | INHALATION_SOLUTION | RESPIRATORY_TRACT | Status: DC
  Administered 2013-09-27: 0.63 mg via RESPIRATORY_TRACT
  Filled 2013-09-27: qty 3

## 2013-09-27 MED ORDER — LISINOPRIL 20 MG PO TABS
20.0000 mg | ORAL_TABLET | Freq: Every day | ORAL | Status: DC
  Administered 2013-09-27 – 2013-09-30 (×4): 20 mg via ORAL
  Filled 2013-09-27 (×4): qty 1

## 2013-09-27 MED ORDER — ADULT MULTIVITAMIN W/MINERALS CH
1.0000 | ORAL_TABLET | Freq: Every day | ORAL | Status: DC
  Administered 2013-09-27 – 2013-09-30 (×4): 1 via ORAL
  Filled 2013-09-27 (×4): qty 1

## 2013-09-27 MED ORDER — ALBUTEROL SULFATE (2.5 MG/3ML) 0.083% IN NEBU
5.0000 mg | INHALATION_SOLUTION | Freq: Once | RESPIRATORY_TRACT | Status: AC
  Administered 2013-09-27: 5 mg via RESPIRATORY_TRACT
  Filled 2013-09-27: qty 6

## 2013-09-27 MED ORDER — ASPIRIN 81 MG PO CHEW
81.0000 mg | CHEWABLE_TABLET | Freq: Every day | ORAL | Status: DC
  Administered 2013-09-27 – 2013-09-30 (×4): 81 mg via ORAL
  Filled 2013-09-27 (×5): qty 1

## 2013-09-27 MED ORDER — SODIUM CHLORIDE 0.9 % IV SOLN
INTRAVENOUS | Status: DC
  Administered 2013-09-27: 20:00:00 via INTRAVENOUS
  Administered 2013-09-28: 1000 mL via INTRAVENOUS
  Administered 2013-09-29: 01:00:00 via INTRAVENOUS

## 2013-09-27 MED ORDER — HYDROCODONE-ACETAMINOPHEN 5-325 MG PO TABS: 1.0000 | ORAL_TABLET | ORAL | Status: DC | PRN

## 2013-09-27 MED ORDER — GUAIFENESIN-CODEINE 100-10 MG/5ML PO SOLN: 5.0000 mL | ORAL | Status: DC | PRN

## 2013-09-27 MED ORDER — ACETAMINOPHEN 650 MG RE SUPP
650.0000 mg | Freq: Four times a day (QID) | RECTAL | Status: DC | PRN
Start: 2013-09-27 — End: 2013-09-30

## 2013-09-27 MED ORDER — METHYLPREDNISOLONE SODIUM SUCC 125 MG IJ SOLR
125.0000 mg | Freq: Once | INTRAMUSCULAR | Status: AC
  Administered 2013-09-27: 125 mg via INTRAVENOUS
  Filled 2013-09-27: qty 2

## 2013-09-27 MED ORDER — IPRATROPIUM BROMIDE 0.02 % IN SOLN
0.5000 mg | RESPIRATORY_TRACT | Status: DC
  Administered 2013-09-27: 0.5 mg via RESPIRATORY_TRACT
  Filled 2013-09-27: qty 2.5

## 2013-09-27 MED ORDER — OMEGA-3-ACID ETHYL ESTERS 1 G PO CAPS
1.0000 g | ORAL_CAPSULE | Freq: Every day | ORAL | Status: DC
  Administered 2013-09-27 – 2013-09-30 (×4): 1 g via ORAL
  Filled 2013-09-27 (×4): qty 1

## 2013-09-27 MED ORDER — ONDANSETRON HCL 4 MG/2ML IJ SOLN: 4.0000 mg | Freq: Four times a day (QID) | INTRAMUSCULAR | Status: DC | PRN

## 2013-09-27 MED ORDER — HYDROMORPHONE HCL PF 1 MG/ML IJ SOLN: 1.0000 mg | INTRAMUSCULAR | Status: DC | PRN

## 2013-09-27 MED ORDER — SODIUM CHLORIDE 0.9 % IJ SOLN
3.0000 mL | Freq: Two times a day (BID) | INTRAMUSCULAR | Status: DC
  Administered 2013-09-27 – 2013-09-30 (×4): 3 mL via INTRAVENOUS

## 2013-09-27 MED ORDER — LEVALBUTEROL HCL 0.63 MG/3ML IN NEBU: 0.6300 mg | INHALATION_SOLUTION | RESPIRATORY_TRACT | Status: DC | PRN

## 2013-09-27 MED ORDER — LEVOFLOXACIN IN D5W 500 MG/100ML IV SOLN
500.0000 mg | INTRAVENOUS | Status: DC
  Administered 2013-09-27 – 2013-09-29 (×3): 500 mg via INTRAVENOUS
  Filled 2013-09-27 (×4): qty 100

## 2013-09-27 MED ORDER — OMEGA-3 FATTY ACIDS 1000 MG PO CAPS: 1.0000 g | ORAL_CAPSULE | Freq: Every day | ORAL | Status: DC

## 2013-09-27 MED ORDER — MENTHOL 3 MG MT LOZG
1.0000 | LOZENGE | OROMUCOSAL | Status: DC | PRN
  Filled 2013-09-27: qty 9

## 2013-09-27 MED ORDER — ACETAMINOPHEN 325 MG PO TABS
650.0000 mg | ORAL_TABLET | Freq: Four times a day (QID) | ORAL | Status: DC | PRN
Start: 2013-09-27 — End: 2013-09-30

## 2013-09-27 MED ORDER — LEVALBUTEROL HCL 0.63 MG/3ML IN NEBU
0.6300 mg | INHALATION_SOLUTION | RESPIRATORY_TRACT | Status: DC
  Administered 2013-09-27 – 2013-09-30 (×14): 0.63 mg via RESPIRATORY_TRACT
  Filled 2013-09-27 (×32): qty 3

## 2013-09-27 MED ORDER — METHYLPREDNISOLONE SODIUM SUCC 125 MG IJ SOLR
60.0000 mg | Freq: Four times a day (QID) | INTRAMUSCULAR | Status: DC
Start: 2013-09-27 — End: 2013-09-28
  Administered 2013-09-27 – 2013-09-28 (×4): 60 mg via INTRAVENOUS
  Filled 2013-09-27: qty 2
  Filled 2013-09-27 (×6): qty 0.96

## 2013-09-27 MED ORDER — IPRATROPIUM BROMIDE 0.02 % IN SOLN
0.5000 mg | RESPIRATORY_TRACT | Status: DC
  Administered 2013-09-27 – 2013-09-30 (×14): 0.5 mg via RESPIRATORY_TRACT
  Filled 2013-09-27 (×14): qty 2.5

## 2013-09-27 NOTE — Telephone Encounter (Signed)
If he is not having some improvement despite prednisone and neb treatments, should go to ER.  We have done all we can do as outpt.   I suspect he will need to be admitted for a few days for IV steroids and nebs around the clock.  Let me know what they decide.

## 2013-09-27 NOTE — ED Provider Notes (Signed)
CSN: [id number]     Arrival date & time 09/27/13  1056 History   First MD Initiated Contact with Patient 09/27/13 1109     Chief Complaint  Patient presents with  . Shortness of Breath     (Consider location/radiation/quality/duration/timing/severity/associated sxs/prior Treatment) Patient is a 58 y.o. male presenting with shortness of breath.  Shortness of Breath Associated symptoms: wheezing    58 yo male presents with worsening SOB x 4 days. Patient has hx COPD. Quit smoking 6 years ago. Patient was seen by PCP on Monday and given steroid injection and started on PO steroids. Patient taken 80 mg yesterday and today. Patient reports becoming worse last night, had to call EMS who gave him 2 breathing treatments. Patient reports to ED today with no improvement in sxs. Admits to dry nonproductive cough. Patient denies any chest pain/tightness, palpitations, dizziness/lightheadedness, recent weight gain, leg swelling, or fever/chills. Patient denies any recent hospitalizations for COPD in the past year. Patient never been intubated. Denies any sick contacts. Patient states he caught a cold about 3 weeks ago that hasnt seem to completely resolve.   Advanced age > 8 yo: Yes HTN: Yes Hyperlipidemia: Yes Cigarette smoking: No Diabetes Mellitus: No Family hx of CAD or MI < 55 yo : No Male or Post menopausal: Yes Cocaine use: No Prior MI: No CABG: No Stress test: Yes (in his 7s) Angina: No   Past Medical History  Diagnosis Date  . COPD (chronic obstructive pulmonary disease) with emphysema   . Esophageal reflux   . Hypertension   . Exertional shortness of breath     "just today" (09/27/2013)   Past Surgical History  Procedure Laterality Date  . Inguinal hernia repair Left 2000's   History reviewed. No pertinent family history. History  Substance Use Topics  . Smoking status: Former Smoker -- 1.00 packs/day for 30 years    Types: Cigarettes    Quit date: 05/18/2007  .  Smokeless tobacco: Never Used  . Alcohol Use: 3.6 oz/week    6 Cans of beer per week     Comment: 09/27/2013 "maybe a 6 pack of beer on the weekends"    Review of Systems  Respiratory: Positive for shortness of breath and wheezing.   All other systems reviewed and are negative.      Allergies  Review of patient's allergies indicates no known allergies.  Home Medications   No current outpatient prescriptions on file. BP 125/75  Pulse 89  Temp(Src) 97.7 F (36.5 C) (Oral)  Resp 20  Ht 5\' 8"  (1.727 m)  Wt 208 lb 3.2 oz (94.439 kg)  BMI 31.66 kg/m2  SpO2 89% Physical Exam  Nursing note and vitals reviewed. Constitutional: He is oriented to person, place, and time. He appears well-developed and well-nourished. No distress.  HENT:  Head: Normocephalic and atraumatic.  Eyes: Conjunctivae and EOM are normal.  Neck: Normal range of motion. Neck supple. No JVD present. No tracheal deviation present.  Cardiovascular: Normal rate and regular rhythm.  Exam reveals no gallop and no friction rub.   No murmur heard. Pulmonary/Chest: No stridor. Not tachypneic. He is in respiratory distress (Mild to moderate). He has wheezes (diffuse expiratory wheezing). He has no rhonchi. He has no rales.  Patient has prolonged expiratory phase. Breathing appears labored. Patient able to talk in complete sentences. No cyanosis noted.   Musculoskeletal: Normal range of motion. He exhibits no edema.  Neurological: He is alert and oriented to person, place, and time.  Skin:  Skin is warm and dry. He is not diaphoretic.  Psychiatric: He has a normal mood and affect. His behavior is normal.    ED Course  Procedures (including critical care time) Labs Review Labs Reviewed  BASIC METABOLIC PANEL - Abnormal; Notable for the following:    Glucose, Bld 136 (*)    All other components within normal limits  CBC WITH DIFFERENTIAL - Abnormal; Notable for the following:    WBC 12.0 (*)    Neutrophils Relative  % 83 (*)    Neutro Abs 10.0 (*)    Lymphocytes Relative 10 (*)    All other components within normal limits  BASIC METABOLIC PANEL - Abnormal; Notable for the following:    Glucose, Bld 138 (*)    All other components within normal limits  CBC - Abnormal; Notable for the following:    WBC 16.4 (*)    All other components within normal limits  MAGNESIUM  D-DIMER, QUANTITATIVE  CBC WITH DIFFERENTIAL   Imaging Review Dg Chest Port 1 View  09/27/2013   CLINICAL DATA:  Shortness of breath, dyspnea, COPD  EXAM: PORTABLE CHEST - 1 VIEW  COMPARISON:  Portable exam 1156 hr compared 04/08/2005  FINDINGS: Normal heart size, mediastinal contours, and pulmonary vascularity.  Lungs clear.  No pleural effusion or pneumothorax.  Bones unremarkable.  IMPRESSION: No definite acute abnormalities.   Electronically Signed   By: Ulyses Southward M.D.   On: 09/27/2013 12:41      MDM   Final diagnoses:  COPD exacerbation   Patient afebrile.  CXR negative Leukocytosis at 12  Patient given IV solumedrol and started on continuous neb.  Patient states he feels better after multiple breathing treatments. Lung exam reveals no improvement of wheezing with breathing treatments.  Patient discussed with Dr. Freida Busman. Plan to admit patient to hospital for COPD exacerbation.   Meds given in ED:  Medications  albuterol (PROVENTIL,VENTOLIN) solution continuous neb (0 mg/hr Nebulization Stopped 09/27/13 1359)  multivitamin with minerals tablet 1 tablet (1 tablet Oral Given 09/28/13 1009)  pantoprazole (PROTONIX) EC tablet 40 mg (40 mg Oral Given 09/28/13 1009)  mometasone-formoterol (DULERA) 100-5 MCG/ACT inhaler 2 puff (2 puffs Inhalation Not Given 09/27/13 2151)  lisinopril (PRINIVIL,ZESTRIL) tablet 20 mg (20 mg Oral Given 09/28/13 1009)  0.9 %  sodium chloride infusion (1,000 mLs Intravenous New Bag/Given 09/28/13 1012)  acetaminophen (TYLENOL) tablet 650 mg (not administered)    Or  acetaminophen (TYLENOL) suppository  650 mg (not administered)  HYDROcodone-acetaminophen (NORCO/VICODIN) 5-325 MG per tablet 1 tablet (not administered)  HYDROmorphone (DILAUDID) injection 1 mg (not administered)  ondansetron (ZOFRAN) tablet 4 mg (not administered)    Or  ondansetron (ZOFRAN) injection 4 mg (not administered)  alum & mag hydroxide-simeth (MAALOX/MYLANTA) 200-200-20 MG/5ML suspension 30 mL (not administered)  enoxaparin (LOVENOX) injection 40 mg (40 mg Subcutaneous Given 09/27/13 2005)  sodium chloride 0.9 % injection 3 mL (3 mLs Intravenous Given 09/28/13 1010)  methylPREDNISolone sodium succinate (SOLU-MEDROL) 125 mg/2 mL injection 60 mg (60 mg Intravenous Given 09/28/13 0601)  levalbuterol (XOPENEX) nebulizer solution 0.63 mg (not administered)  guaiFENesin-codeine 100-10 MG/5ML solution 5 mL (not administered)  zolpidem (AMBIEN) tablet 5 mg (5 mg Oral Given 09/27/13 2131)  menthol-cetylpyridinium (CEPACOL) lozenge 3 mg (not administered)  levofloxacin (LEVAQUIN) IVPB 500 mg (500 mg Intravenous Given 09/27/13 2006)  aspirin chewable tablet 81 mg (81 mg Oral Given 09/28/13 1009)  omega-3 acid ethyl esters (LOVAZA) capsule 1 g (1 g Oral Given 09/28/13 1009)  levalbuterol (  XOPENEX) nebulizer solution 0.63 mg (0.63 mg Nebulization Given 09/28/13 1121)    And  ipratropium (ATROVENT) nebulizer solution 0.5 mg (0.5 mg Nebulization Given 09/28/13 1121)  albuterol (PROVENTIL) (2.5 MG/3ML) 0.083% nebulizer solution 5 mg (5 mg Nebulization Given 09/27/13 1116)  methylPREDNISolone sodium succinate (SOLU-MEDROL) 125 mg/2 mL injection 125 mg (125 mg Intravenous Given 09/27/13 1222)  albuterol (PROVENTIL,VENTOLIN) solution continuous neb (10 mg/hr Nebulization Given 09/27/13 1448)    Current Discharge Medication List           [name], [place] 09/28/13 1126

## 2013-09-27 NOTE — ED Notes (Signed)
Pt reports SOB since yesterday with hx of COPD. Went to pcp yesterday given breathing treatment and shot of steroids. Last night wheezing continued called EMS to give another breathing treatment. At current pt has audible wheezing. Denies pain. Pt is a x 4. Wife at bedside.

## 2013-09-27 NOTE — Telephone Encounter (Signed)
I called and spoke spouse. She will take pt to the ED. [facility] [place]

## 2013-09-27 NOTE — ED Notes (Signed)
Pt ambulated in hall- became short of breath, audible wheezing noted.  92%on RA.

## 2013-09-27 NOTE — ED Notes (Signed)
PA at the bedside.

## 2013-09-27 NOTE — ED Provider Notes (Addendum)
Medical screening examination/treatment/procedure(s) were conducted as a shared visit with non-physician practitioner(s) and myself.  I personally evaluated the patient during the encounter.  EKG Interpretation   None      Patient here with 3 day history of shortness of breath with associated wheezing. Denies any fever or chills. Does have a remote history of COPD. Was given albuterol here and continues to wheeze. Plan will be to repeat albuterol treatment and likely admit the patient.  4:32 PM She given a second albuterol treatment and continues with moderate wheezing. Will be admitted to medicine  Toy BakerAnthony T Jairon Ripberger, MD 09/27/13 1422  Toy BakerAnthony T Pier Bosher, MD 09/27/13 [phone]

## 2013-09-27 NOTE — H&P (Signed)
History and Physical       Hospital Admission Note Date: 09/27/2013  Patient name: [name] Medical record number: [id number] Date of birth: 1956-06-02 Age: 58 y.o. Gender: male  PCP: Joycelyn Rua, MD  Primary pulmonologist: Dr. Shelle Iron  Chief Complaint:  Shortness of breath with wheezing for last 3 days  HPI: The patient is a 58 year old male with history of COPD, hypertension, GERD presented with worsening shortness of breath and wheezing for the last 3 days. Patient reported that [place],  where he noticed that he was getting short of breath while walking. He did rest on Sunday but continued to worsen. Patient saw his pulmonologist, Dr. Shelle Iron yesterday, was given steroid injection and placed on prednisone taper. Patient however did not see significant improvement and was using his albuterol inhaler every 4 hours and Advair. The patient was recommended by his pulmonologist to come to the ER. At the time of my examination, patient is diffusely wheezing bilaterally. He denies any significant fevers or chills or productive cough. He did have significant shortness of breath on ambulation.    Review of Systems:  Constitutional: + fever, chills 2 weeks ago, patient has received a flu shot this year, no diaphoresis,+ poor appetite and fatigue.  HEENT: Denies photophobia, eye pain, redness, hearing loss, ear pain, + congestion, no sore throat, rhinorrhea, sneezing, mouth sores, trouble swallowing, neck pain, neck stiffness and tinnitus.   Respiratory: Please see history of present illness  Cardiovascular: Denies chest pain, palpitations and leg swelling.  Gastrointestinal: Denies nausea, vomiting, abdominal pain, diarrhea, constipation, blood in stool and abdominal distention.  Genitourinary: Denies dysuria, urgency, frequency, hematuria, flank pain and difficulty urinating.  Musculoskeletal:  Denies myalgias, back pain, joint swelling, arthralgias and gait problem.  Skin: Denies pallor, rash and wound.  Neurological: Denies dizziness, seizures, syncope, weakness, light-headedness, numbness and headaches.  Hematological: Denies adenopathy. Easy bruising, personal or family bleeding history  Psychiatric/Behavioral: Denies suicidal ideation, mood changes, confusion, nervousness, sleep disturbance and agitation  Past Medical History: Past Medical History  Diagnosis Date  . COPD (chronic obstructive pulmonary disease) with emphysema   . Esophageal reflux    History reviewed. No pertinent past surgical history.  Medications: Prior to Admission medications   Medication Sig Start Date End Date Taking? Authorizing Provider  albuterol (PROVENTIL HFA;VENTOLIN HFA) 108 (90 BASE) MCG/ACT inhaler Inhale 2 puffs into the lungs every 6 (six) hours as needed for shortness of breath.    Yes Historical Provider, MD  aspirin 81 MG tablet Take 81 mg by mouth daily.     Yes Historical Provider, MD  esomeprazole (NEXIUM) 20 MG capsule Take 20 mg by mouth daily.    Yes Historical Provider, MD  fish oil-omega-3 fatty acids 1000 MG capsule Take 1 g by mouth daily.     Yes Historical Provider, MD  Fluticasone-Salmeterol (ADVAIR DISKUS) 250-50 MCG/DOSE AEPB Inhale 1 puff into the lungs 2 (two) times daily. 08/07/13  Yes Barbaraann Share, MD  lisinopril (PRINIVIL,ZESTRIL) 20 MG tablet Take 20 mg by mouth daily.     Yes Historical Provider, MD  Multiple Vitamin (MULTIVITAMIN WITH MINERALS) TABS tablet Take 1 tablet by mouth daily.   Yes Historical Provider, MD  predniSONE (DELTASONE) 10 MG tablet Take 10-40 mg by mouth See admin instructions. Take 4 tablets daily for 2 days, take 3 tablets daily for 2 days, take 2 tablets daily for 2 days, take 1 tablet daily for 2 days.  STOP   Yes Historical Provider, MD    Allergies:  No Known Allergies  Social History:  reports that he quit smoking about 6 years ago.  His smoking use included Cigarettes. He has a 30 pack-year smoking history. He does not have any smokeless tobacco history on file. He reports that he drinks alcohol. His drug history is not on file.  Family History: No family history on file.  Physical Exam: Blood pressure 116/67, pulse 100, temperature 97.9 F (36.6 C), temperature source Oral, resp. rate 20, height 5\' 8"  (1.727 m), weight 90.719 kg (200 lb), SpO2 96.00%. General: Alert, awake, oriented x3, in mild distress, somewhat anxious. HEENT: normocephalic, atraumatic, anicteric sclera, pink conjunctiva, pupils equal and reactive to light and accomodation, oropharynx clear Neck: supple, no masses or lymphadenopathy, no goiter, no bruits  Heart: Tachycardia Regular rate and rhythm, without murmurs, rubs or gallops. Lungs: Diffuse expiratory wheezing bilaterally Abdomen: Soft, nontender, nondistended, positive bowel sounds, no masses. Extremities: No clubbing, cyanosis or edema with positive pedal pulses. Neuro: Grossly intact, no focal neurological deficits, strength 5/5 upper and lower extremities bilaterally Psych: alert and oriented x 3, normal mood and affect Skin: no rashes or lesions, warm and dry   LABS on Admission:  Basic Metabolic Panel:  Recent Labs Lab 09/27/13 1140  NA 141  K 4.6  CL 100  CO2 21  GLUCOSE 136*  BUN 15  CREATININE 0.71  CALCIUM 9.7   Liver Function Tests: No results found for this basename: AST, ALT, ALKPHOS, BILITOT, PROT, ALBUMIN,  in the last 168 hours No results found for this basename: LIPASE, AMYLASE,  in the last 168 hours No results found for this basename: AMMONIA,  in the last 168 hours CBC:  Recent Labs Lab 09/27/13 1230  WBC 12.0*  NEUTROABS 10.0*  HGB 16.4  HCT 46.0  MCV 88.8  PLT 229   Cardiac Enzymes: No results found for this basename: CKTOTAL, CKMB, CKMBINDEX, TROPONINI,  in the last 168 hours BNP: No components found with this basename: POCBNP,  CBG: No  results found for this basename: GLUCAP,  in the last 168 hours   Radiological Exams on Admission: Dg Chest Port 1 View  09/27/2013   CLINICAL DATA:  Shortness of breath, dyspnea, COPD  EXAM: PORTABLE CHEST - 1 VIEW  COMPARISON:  Portable exam 1156 hr compared 04/08/2005  FINDINGS: Normal heart size, mediastinal contours, and pulmonary vascularity.  Lungs clear.  No pleural effusion or pneumothorax.  Bones unremarkable.  IMPRESSION: No definite acute abnormalities.   Electronically Signed   By: Ulyses Southward M.D.   On: 09/27/2013 12:41    Assessment/Plan Principal Problem:   COPD exacerbation - Will admit to telemetry, placed on scheduled to Xopenex and Atrovent nebs q4hours. He is jittery and tachycardiac with HR in 100's with albuterol.  - Placed on Dulera, O2, flutter valve, antitussives, IV scheduled Solu-Medrol, will taper gradually, IV Levaquin - May need nebs machine at the time of DC  Active Problems:   Tachycardia - Placed on Xopenex nebs, hopefully tachycardia will improve - Will check a d-dimer although my suspicion is low and his symptoms are more consistent with acute COPD exacerbation     GERD (gastroesophageal reflux disease) - Continue PPI    HTN (hypertension) -Continue lisinopril    DVT prophylaxis: Lovenox  CODE STATUS: Full code status  Family Communication: Admission, patients condition and plan of care including tests being ordered have been discussed with the patient and wife  who  indicates understanding and agree with the plan and Code Status   Further plan will depend as patient's clinical course evolves and further radiologic and laboratory data become available.   Time Spent on Admission: 1 hour  RAI,RIPUDEEP M.D. [facility] 09/27/2013, 5:14 PM Pager: [phone]  If 7PM-7AM, please contact night-coverage [email or web address] Password [email or web address]

## 2013-09-27 NOTE — ED Notes (Signed)
Lab called and stated cbc was clotted. New order put in MD made aware.

## 2013-09-27 NOTE — Telephone Encounter (Signed)
Per OV 09/26/13: Patient Instructions      No change in your usual maintenance meds. Will give you a steroid shot that should kick in within a few hours. Start on prednisone taper over 8 days If you are not getting better, please call us.  If you are getting worse and during the night, go to ER  --  I called and spoke with spouse. She reports pt continued to get worse last night. Pt was to the point he could not talk. Called EMS-gave breathing TX. Pt feels okay at rest but as soon as he gets up he is very SOB. Pt did start the pred taper already yesterday. Was told to call if he is not getting better and no improvement at all so far. Please advise KC thanks  No Known Allergies

## 2013-09-27 NOTE — ED Notes (Signed)
Pt tolerating breathing treatment well.

## 2013-09-28 LAB — CBC
HEMATOCRIT: 44.6 % (ref 39.0–52.0)
Hemoglobin: 15.5 g/dL (ref 13.0–17.0)
MCH: 31.3 pg (ref 26.0–34.0)
MCHC: 34.8 g/dL (ref 30.0–36.0)
MCV: 89.9 fL (ref 78.0–100.0)
Platelets: 270 10*3/uL (ref 150–400)
RBC: 4.96 MIL/uL (ref 4.22–5.81)
RDW: 13.5 % (ref 11.5–15.5)
WBC: 16.4 10*3/uL — ABNORMAL HIGH (ref 4.0–10.5)

## 2013-09-28 LAB — BASIC METABOLIC PANEL
BUN: 22 mg/dL (ref 6–23)
CALCIUM: 9.4 mg/dL (ref 8.4–10.5)
CHLORIDE: 100 meq/L (ref 96–112)
CO2: 21 mEq/L (ref 19–32)
CREATININE: 0.84 mg/dL (ref 0.50–1.35)
GFR calc non Af Amer: >90 mL/min (ref >90)
Glucose, Bld: 138 mg/dL — ABNORMAL HIGH (ref 70–99)
Potassium: 4.3 mEq/L (ref 3.7–5.3)
Sodium: 140 mEq/L (ref 137–147)

## 2013-09-28 MED ORDER — METHYLPREDNISOLONE SODIUM SUCC 125 MG IJ SOLR
60.0000 mg | Freq: Two times a day (BID) | INTRAMUSCULAR | Status: DC
  Administered 2013-09-28: 60 mg via INTRAVENOUS
  Filled 2013-09-28 (×3): qty 0.96

## 2013-09-28 NOTE — Progress Notes (Signed)
Patient ID: [name]  male  ZOX:[id number]    DOB: Apr 18, 1956    DOA: 09/27/2013  PCP: Joycelyn RuaMEYERS, STEPHEN, MD  Assessment/Plan: Principal Problem:   COPD exacerbation - Improving, continue scheduled nebs, dulera, O2, flutter valve - Taper IV steroids, if improving tomorrow will transition to oral prednisone - Continue IV Levaquin - DME nebulizer machine  Active Problems:   Tachycardia resolved     GERD (gastroesophageal reflux disease) - Continue PPI    HTN (hypertension) - Currently stable  DVT Prophylaxis:  Code Status:  Family Communication:Discussed in detail with patient, alert and oriented   Disposition: Likely tomorrow improving    Subjective: Feels overall better from yesterday, still wheezing   Objective: Weight change:   Intake/Output Summary (Last 24 hours) at 09/28/13 1526 Last data filed at 09/27/13 2200  Gross per 24 hour  Intake 303.75 ml  Output      0 ml  Net 303.75 ml   Blood pressure 121/74, pulse 95, temperature 97.7 F (36.5 C), temperature source Oral, resp. rate 18, height 5\' 8"  (1.727 m), weight 94.439 kg (208 lb 3.2 oz), SpO2 94.00%.  Physical Exam: General: Alert and awake, oriented x3, not in any acute distress. CVS: S1-S2 clear, no murmur rubs or gallops Chest: bilateral expiratory wheezing  Abdomen: soft nontender, nondistended, normal bowel sounds  Extremities: no cyanosis, clubbing or edema noted bilaterally   Lab Results: Basic Metabolic Panel:  Recent Labs Lab 09/27/13 1140 09/28/13 0645  NA 141 140  K 4.6 4.3  CL 100 100  CO2 21 21  GLUCOSE 136* 138*  BUN 15 22  CREATININE 0.71 0.84  CALCIUM 9.7 9.4  MG 2.0  --    Liver Function Tests: No results found for this basename: AST, ALT, ALKPHOS, BILITOT, PROT, ALBUMIN,  in the last 168 hours No results found for this basename: LIPASE, AMYLASE,  in the last 168 hours No results found for this basename: AMMONIA,  in the last 168 hours CBC:  Recent Labs Lab  09/27/13 1230 09/28/13 0645  WBC 12.0* 16.4*  NEUTROABS 10.0*  --   HGB 16.4 15.5  HCT 46.0 44.6  MCV 88.8 89.9  PLT 229 270   Cardiac Enzymes: No results found for this basename: CKTOTAL, CKMB, CKMBINDEX, TROPONINI,  in the last 168 hours BNP: No components found with this basename: POCBNP,  CBG: No results found for this basename: GLUCAP,  in the last 168 hours   Micro Results: No results found for this or any previous visit (from the past 240 hour(s)).  Studies/Results: Dg Chest Port 1 View  09/27/2013   CLINICAL DATA:  Shortness of breath, dyspnea, COPD  EXAM: PORTABLE CHEST - 1 VIEW  COMPARISON:  Portable exam 1156 hr compared 04/08/2005  FINDINGS: Normal heart size, mediastinal contours, and pulmonary vascularity.  Lungs clear.  No pleural effusion or pneumothorax.  Bones unremarkable.  IMPRESSION: No definite acute abnormalities.   Electronically Signed   By: Ulyses SouthwardMark  Boles M.D.   On: 09/27/2013 12:41    Medications: Scheduled Meds: . aspirin  81 mg Oral Daily  . enoxaparin (LOVENOX) injection  40 mg Subcutaneous Q24H  . levalbuterol  0.63 mg Nebulization Q4H   And  . ipratropium  0.5 mg Nebulization Q4H  . levofloxacin (LEVAQUIN) IV  500 mg Intravenous Q24H  . lisinopril  20 mg Oral Daily  . methylPREDNISolone (SOLU-MEDROL) injection  60 mg Intravenous Q6H  . mometasone-formoterol  2 puff Inhalation BID  . multivitamin with minerals  1 tablet Oral Daily  . omega-3 acid ethyl esters  1 g Oral Daily  . pantoprazole  40 mg Oral Daily  . sodium chloride  3 mL Intravenous Q12H      LOS: 1 day   RAI,RIPUDEEP M.D. [facility] 09/28/2013, 3:26 PM Pager: [phone]  If 7PM-7AM, please contact night-coverage [email or web address] Password [email or web address]

## 2013-09-28 NOTE — Progress Notes (Signed)
Utilization review completed.  

## 2013-09-28 NOTE — Care Management Note (Unsigned)
    Page 1 of 1   09/29/2013     11:57:35 AM   CARE MANAGEMENT NOTE 09/29/2013  Patient:  [name],[name]   Account Number:  [id number]  Date Initiated:  09/28/2013  Documentation initiated by:  Letha CapeAYLOR,Misheel Gowans  Subjective/Objective Assessment:   dx copd  admit- lives with spouse.     Action/Plan:   Anticipated DC Date:  09/30/2013   Anticipated DC Plan:  HOME/SELF CARE      DC Planning Services  CM consult      PAC Choice  DURABLE MEDICAL EQUIPMENT   Choice offered to / List presented to:  C-1 Patient   DME arranged  NEBULIZER MACHINE      DME agency  [facility]        Status of service:  In process, will continue to follow Medicare Important Message given?   (If response is "NO", the following Medicare IM given date fields will be blank) Date Medicare IM given:   Date Additional Medicare IM given:    Discharge Disposition:    Per UR Regulation:  Reviewed for med. necessity/level of care/duration of stay  If discussed at Long Length of Stay Meetings, dates discussed:    Comments:  09/29/13 1156 Letha Capeeborah Elisah Parmer RN, BSN [phone] [id number] patient lives with spouse, pta indep.  Patient for dc tomorrow will need nebulizer machine, patient states would like to use [facility], [facility]

## 2013-09-29 ENCOUNTER — Telehealth: Payer: Self-pay | Admitting: Pulmonary Disease

## 2013-09-29 MED ORDER — PREDNISONE 50 MG PO TABS
60.0000 mg | ORAL_TABLET | Freq: Every day | ORAL | Status: DC
  Administered 2013-09-30: 60 mg via ORAL
  Filled 2013-09-29 (×2): qty 1

## 2013-09-29 MED ORDER — METHYLPREDNISOLONE SODIUM SUCC 40 MG IJ SOLR
40.0000 mg | Freq: Two times a day (BID) | INTRAMUSCULAR | Status: AC
  Administered 2013-09-29 (×2): 40 mg via INTRAVENOUS
  Filled 2013-09-29 (×2): qty 1

## 2013-09-29 NOTE — ED Provider Notes (Signed)
Medical screening examination/treatment/procedure(s) were conducted as a shared visit with non-physician practitioner(s) and myself.  I personally evaluated the patient during the encounter.     Toy BakerAnthony T Shontel Santee, MD 09/29/13 2017

## 2013-09-29 NOTE — Telephone Encounter (Signed)
Appt scheduled on 2/19 with TP.Juanita Jinny SandersS Davis

## 2013-09-29 NOTE — Progress Notes (Signed)
Patient ID: [name]  male  ZOX:[id number]    DOB: Apr 18, 1956    DOA: 09/27/2013  PCP: Joycelyn Rua, MD  Assessment/Plan: Principal Problem:   COPD exacerbation - Improving, continue scheduled nebs, dulera, O2, flutter valve - Taper IV steroids, transition to oral prednisone in a.m.  - Continue IV Levaquin - DME nebulizer machine  Active Problems:   Tachycardia resolved     GERD (gastroesophageal reflux disease) - Continue PPI    HTN (hypertension) - Currently stable  DVT Prophylaxis:  Code Status:  Family Communication:Discussed in detail with patient, alert and oriented   Disposition: Likely tomorrow improving, he will need prescription for albuterol inhaler as well, besides nebulizer solution, dulera, prednisone with taper, levofloxacin. I have made him an appointment with Dr. Teddy Spike office with Rennie Natter NP for follow-up on 10/05/13. He will need a work note at DC.    Subjective: Feels overall better from admission , wheezing and coughing improving. Feels flushed and tremulous with IV steroids  Objective: Weight change:   Intake/Output Summary (Last 24 hours) at 09/29/13 1357 Last data filed at 09/29/13 1328  Gross per 24 hour  Intake   1180 ml  Output      0 ml  Net   1180 ml   Blood pressure 158/84, pulse 96, temperature 97.6 F (36.4 C), temperature source Oral, resp. rate 20, height 5\' 8"  (1.727 m), weight 94.439 kg (208 lb 3.2 oz), SpO2 94.00%.  Physical Exam: General: Alert and awake, oriented x3,NAD CVS: S1-S2 clear, no murmur rubs or gallops Chest: bilateral expiratory wheezing improving Abdomen: soft nontender, nondistended, normal bowel sounds  Extremities: no cyanosis, clubbing or edema noted bilaterally   Lab Results: Basic Metabolic Panel:  Recent Labs Lab 09/27/13 1140 09/28/13 0645  NA 141 140  K 4.6 4.3  CL 100 100  CO2 21 21  GLUCOSE 136* 138*  BUN 15 22  CREATININE 0.71 0.84  CALCIUM 9.7 9.4  MG 2.0  --    Liver  Function Tests: No results found for this basename: AST, ALT, ALKPHOS, BILITOT, PROT, ALBUMIN,  in the last 168 hours No results found for this basename: LIPASE, AMYLASE,  in the last 168 hours No results found for this basename: AMMONIA,  in the last 168 hours CBC:  Recent Labs Lab 09/27/13 1230 09/28/13 0645  WBC 12.0* 16.4*  NEUTROABS 10.0*  --   HGB 16.4 15.5  HCT 46.0 44.6  MCV 88.8 89.9  PLT 229 270   Cardiac Enzymes: No results found for this basename: CKTOTAL, CKMB, CKMBINDEX, TROPONINI,  in the last 168 hours BNP: No components found with this basename: POCBNP,  CBG: No results found for this basename: GLUCAP,  in the last 168 hours   Micro Results: No results found for this or any previous visit (from the past 240 hour(s)).  Studies/Results: Dg Chest Port 1 View  09/27/2013   CLINICAL DATA:  Shortness of breath, dyspnea, COPD  EXAM: PORTABLE CHEST - 1 VIEW  COMPARISON:  Portable exam 1156 hr compared 04/08/2005  FINDINGS: Normal heart size, mediastinal contours, and pulmonary vascularity.  Lungs clear.  No pleural effusion or pneumothorax.  Bones unremarkable.  IMPRESSION: No definite acute abnormalities.   Electronically Signed   By: Ulyses Southward M.D.   On: 09/27/2013 12:41    Medications: Scheduled Meds: . aspirin  81 mg Oral Daily  . enoxaparin (LOVENOX) injection  40 mg Subcutaneous Q24H  . levalbuterol  0.63 mg Nebulization Q4H  And  . ipratropium  0.5 mg Nebulization Q4H  . levofloxacin (LEVAQUIN) IV  500 mg Intravenous Q24H  . lisinopril  20 mg Oral Daily  . methylPREDNISolone (SOLU-MEDROL) injection  40 mg Intravenous Q12H  . mometasone-formoterol  2 puff Inhalation BID  . multivitamin with minerals  1 tablet Oral Daily  . omega-3 acid ethyl esters  1 g Oral Daily  . pantoprazole  40 mg Oral Daily  . [START ON 09/30/2013] predniSONE  60 mg Oral Q breakfast  . sodium chloride  3 mL Intravenous Q12H      LOS: 2 days   RAI,RIPUDEEP M.D. [facility] 09/29/2013, 1:57 PM Pager: [phone]  If 7PM-7AM, please contact night-coverage [email or web address] Password [email or web address]

## 2013-09-30 DIAGNOSIS — I10 Essential (primary) hypertension: Secondary | ICD-10-CM

## 2013-09-30 DIAGNOSIS — J449 Chronic obstructive pulmonary disease, unspecified: Secondary | ICD-10-CM

## 2013-09-30 DIAGNOSIS — J441 Chronic obstructive pulmonary disease with (acute) exacerbation: Secondary | ICD-10-CM

## 2013-09-30 DIAGNOSIS — K219 Gastro-esophageal reflux disease without esophagitis: Secondary | ICD-10-CM

## 2013-09-30 MED ORDER — LEVALBUTEROL HCL 0.63 MG/3ML IN NEBU: 0.6300 mg | INHALATION_SOLUTION | RESPIRATORY_TRACT | Status: DC

## 2013-09-30 MED ORDER — ALBUTEROL SULFATE HFA 108 (90 BASE) MCG/ACT IN AERS: 2.0000 | INHALATION_SPRAY | Freq: Four times a day (QID) | RESPIRATORY_TRACT | Status: DC | PRN

## 2013-09-30 MED ORDER — IPRATROPIUM BROMIDE 0.02 % IN SOLN: 0.5000 mg | RESPIRATORY_TRACT | Status: DC

## 2013-09-30 MED ORDER — FLUTICASONE-SALMETEROL 250-50 MCG/DOSE IN AEPB: 1.0000 | INHALATION_SPRAY | Freq: Two times a day (BID) | RESPIRATORY_TRACT | Status: DC

## 2013-09-30 MED ORDER — PREDNISONE (PAK) 10 MG PO TABS: ORAL_TABLET | Freq: Every day | ORAL | Status: DC

## 2013-09-30 MED ORDER — LEVOFLOXACIN 500 MG PO TABS: 500.0000 mg | ORAL_TABLET | Freq: Every day | ORAL | Status: DC

## 2013-09-30 NOTE — Discharge Summary (Signed)
Physician Discharge Summary  [name] BJY:[id number] DOB: [birth year] DOA: 09/27/2013  PCP: Joycelyn Rua, MD  Admit date: 09/27/2013 Discharge date: 09/30/2013  Time spent: 30 minutes  Recommendations for Outpatient Follow-up:  1. Follow upwith Dr Shelle Iron as recommended 2. Follow upwith PCP in one week.   Discharge Diagnoses:  Principal Problem:   COPD exacerbation Active Problems:   Tachycardia   GERD (gastroesophageal reflux disease)   HTN (hypertension)   Discharge Condition: improved.   Diet recommendation: low sodium diet  Filed Weights   09/27/13 1105 09/27/13 1812 09/28/13 0616  Weight: 90.719 kg (200 lb) 93.214 kg (205 lb 8 oz) 94.439 kg (208 lb 3.2 oz)    History of present illness:  The patient is a 58 year old male with history of COPD, hypertension, GERD presented with worsening shortness of breath and wheezing for the last 3 days. Patient saw his pulmonologist, Dr. Shelle Iron before the day of admission, was given steroid injection and placed on prednisone taper. Patient however did not see significant improvement and was using his albuterol inhaler every 4 hours and Advair. The patient was recommended by his pulmonologist to come to the ER. He was admitted to the hospitalist service for the management of copd exacerbation.     Hospital Course:   COPD  With acute exacerbation  Improved.  continue bronchodilators, dulera,. He was started on IV steroids and his wheezing has improved and he was transitioned to oral prednisone tapered. Continue with levaquin to complete the course.  - DME nebulizer machine   Tachycardia resolved  GERD (gastroesophageal reflux disease)  - Continue PPI  HTN (hypertension)  - Currently stable   Procedures:  CXR  Consultations:  None   Discharge Exam: Filed Vitals:   09/30/13 1454  BP: 162/93  Pulse: 91  Temp: 98.1 F (36.7 C)  Resp: 20    General: Alert and awake, oriented x3,NAD  CVS: S1-S2 clear, no murmur  rubs or gallops  Chest: wheezing improved. Good air entry bilateral.  Abdomen: soft nontender, nondistended, normal bowel sounds  Extremities: no cyanosis, clubbing or edema noted bilaterally   Discharge Instructions  Discharge Orders   Future Appointments Provider Department Dept Phone   10/05/2013 2:45 PM Tammy Rogers Seeds, NP [facility] [phone]   Future Orders Complete By Expires   Discharge instructions  As directed    Comments:     Follow up with PCP in one week Followup with pulmonology as recommended.       Medication List    STOP taking these medications       predniSONE 10 MG tablet  Commonly known as:  DELTASONE  Replaced by:  predniSONE 10 MG tablet      TAKE these medications       albuterol 108 (90 BASE) MCG/ACT inhaler  Commonly known as:  PROVENTIL HFA;VENTOLIN HFA  Inhale 2 puffs into the lungs every 6 (six) hours as needed for shortness of breath.     aspirin 81 MG tablet  Take 81 mg by mouth daily.     esomeprazole 20 MG capsule  Commonly known as:  NEXIUM  Take 20 mg by mouth daily.     fish oil-omega-3 fatty acids 1000 MG capsule  Take 1 g by mouth daily.     Fluticasone-Salmeterol 250-50 MCG/DOSE Aepb  Commonly known as:  ADVAIR DISKUS  Inhale 1 puff into the lungs 2 (two) times daily.     ipratropium 0.02 % nebulizer solution  Commonly known as:  ATROVENT  Take 2.5 mLs (0.5 mg total) by nebulization every 4 (four) hours.     levalbuterol 0.63 MG/3ML nebulizer solution  Commonly known as:  XOPENEX  Take 3 mLs (0.63 mg total) by nebulization every 4 (four) hours.     levofloxacin 500 MG tablet  Commonly known as:  LEVAQUIN  Take 1 tablet (500 mg total) by mouth daily.     lisinopril 20 MG tablet  Commonly known as:  PRINIVIL,ZESTRIL  Take 20 mg by mouth daily.     multivitamin with minerals Tabs tablet  Take 1 tablet by mouth daily.     predniSONE 10 MG tablet  Commonly known as:  STERAPRED UNI-PAK  - Take by mouth  daily. Prednisone 40 mg daily for 3 days followed by  - Prednisone 20 mg daily for 3 days followed by  - Prednisone 10 mg daily for 2 days.       No Known Allergies     Follow-up Information   Follow up with Barbaraann ShareLANCE,KEITH M, MD On 10/05/2013. (AT 2:45 pm, appt with Rubye Oaksammy Parrett, NP)    Specialty:  Pulmonary Disease   Contact information:   [address] [place] [address] [phone]       Follow up with Joycelyn RuaMEYERS, STEPHEN, MD. Schedule an appointment as soon as possible for a visit in 1 week.   Specialty:  Family Medicine   Contact information:   [address] [place] [address] [phone]        The results of significant diagnostics from this hospitalization (including imaging, microbiology, ancillary and laboratory) are listed below for reference.    Significant Diagnostic Studies: Dg Chest Port 1 View  09/27/2013   CLINICAL DATA:  Shortness of breath, dyspnea, COPD  EXAM: PORTABLE CHEST - 1 VIEW  COMPARISON:  Portable exam 1156 hr compared 04/08/2005  FINDINGS: Normal heart size, mediastinal contours, and pulmonary vascularity.  Lungs clear.  No pleural effusion or pneumothorax.  Bones unremarkable.  IMPRESSION: No definite acute abnormalities.   Electronically Signed   By: Ulyses SouthwardMark  Boles M.D.   On: 09/27/2013 12:41    Microbiology: No results found for this or any previous visit (from the past 240 hour(s)).   Labs: Basic Metabolic Panel:  Recent Labs Lab 09/27/13 1140 09/28/13 0645  NA 141 140  K 4.6 4.3  CL 100 100  CO2 21 21  GLUCOSE 136* 138*  BUN 15 22  CREATININE 0.71 0.84  CALCIUM 9.7 9.4  MG 2.0  --    Liver Function Tests: No results found for this basename: AST, ALT, ALKPHOS, BILITOT, PROT, ALBUMIN,  in the last 168 hours No results found for this basename: LIPASE, AMYLASE,  in the last 168 hours No results found for this basename: AMMONIA,  in the last 168 hours CBC:  Recent Labs Lab 09/27/13 1230 09/28/13 0645  WBC 12.0*  16.4*  NEUTROABS 10.0*  --   HGB 16.4 15.5  HCT 46.0 44.6  MCV 88.8 89.9  PLT 229 270   Cardiac Enzymes: No results found for this basename: CKTOTAL, CKMB, CKMBINDEX, TROPONINI,  in the last 168 hours BNP: BNP (last 3 results) No results found for this basename: PROBNP,  in the last 8760 hours CBG: No results found for this basename: GLUCAP,  in the last 168 hours     Signed:  Sahaana Weitman  [facility] 09/30/2013, 3:12 PM

## 2013-09-30 NOTE — Discharge Instructions (Signed)
Chronic Obstructive Pulmonary Disease Exacerbation  Chronic obstructive pulmonary disease (COPD) is a common lung problem. In COPD, the flow of air from the lungs is limited. COPD exacerbations are times that breathing gets worse and you need extra treatment. Without treatment they can be life threatening. If they happen often, your lungs can become more damaged. HOME CARE  Do not smoke.  Avoid tobacco smoke and other things that bother your lungs.  If given, take your antibiotic medicine as told. Finish the medicine even if you start to feel better.  Only take medicines as told by your doctor.  Drink enough fluids to keep your pee (urine) clear or pale yellow (unless your doctor has told you not to).  Use a cool mist machine (vaporizer).  If you use oxygen or a machine that turns liquid medicine into a mist (nebulizer), continue to use them as told.  Keep up with shots (vaccinations) as told by your doctor.  Exercise regularly.  Eat healthy foods.  Keep all doctor visits as told. GET HELP RIGHT AWAY IF:  You are very short of breath and it gets worse.  You have trouble talking.  You have bad chest pain.  You have blood in your spit (sputum).  You have a fever.  You keep throwing up (vomiting).  You feel weak, or you pass out (faint).  You feel confused.  You keep getting worse. MAKE SURE YOU:   Understand these instructions.  Will watch your condition.  Will get help right away if you are not doing well or get worse. Document Released: 07/23/2011 Document Revised: 05/24/2013 Document Reviewed: 04/07/2013 ExitCare Patient Information 2014 [employer].  

## 2013-10-05 ENCOUNTER — Ambulatory Visit (INDEPENDENT_AMBULATORY_CARE_PROVIDER_SITE_OTHER): Payer: BC Managed Care – PPO | Admitting: Adult Health

## 2013-10-05 ENCOUNTER — Encounter: Payer: Self-pay | Admitting: Adult Health

## 2013-10-05 VITALS — BP 124/74 | HR 76 | Temp 98.6°F | Ht 68.0 in | Wt 217.2 lb

## 2013-10-05 DIAGNOSIS — J441 Chronic obstructive pulmonary disease with (acute) exacerbation: Secondary | ICD-10-CM

## 2013-10-05 MED ORDER — TIOTROPIUM BROMIDE MONOHYDRATE 18 MCG IN CAPS: 18.0000 ug | ORAL_CAPSULE | Freq: Every day | RESPIRATORY_TRACT | Status: DC

## 2013-10-05 NOTE — Patient Instructions (Addendum)
Finish Prednisone as directed.  Continue on Advair 1 puff Twice daily   Stop Ipratropium Neb  May use Xopenex Neb every 4hrs as needed for wheezing/shortrness of breath.  Begin Spiriva 1 puff daily  If recurrent cough/wheezing consider change  Lisinopril as this may be contributing.  follow up Dr. Shelle Ironlance in 6 weeks and As needed

## 2013-10-05 NOTE — Progress Notes (Signed)
   Subjective:    Patient ID: [name], male    DOB: 1956-01-23, 58 y.o.   MRN: [id number]  HPI 58 yo male with known hx of COPD   10/05/2013 Post Hospital follow up  Patient presents for a post hospital followup. Patient was admitted February 11 2 February 14 for COPD, exacerbation. She was treated with IV antibiotics,IV steroids, and nebulized bronchodilators. And she was transitioned over to Levaquin and prednisone taper. At discharge.  Chest x-ray showed no acute process. Reports breathing is improving every day since discharge.  Still taking pred taper at 20mg  daily-has few days left.  He denies any chest pain, orthopnea, PND, leg swelling, or fever.       Review of Systems Constitutional:   No  weight loss, night sweats,  Fevers, chills,  +fatigue, or  lassitude.  HEENT:   No headaches,  Difficulty swallowing,  Tooth/dental problems, or  Sore throat,                No sneezing, itching, ear ache, nasal congestion, post nasal drip,   CV:  No chest pain,  Orthopnea, PND, swelling in lower extremities, anasarca, dizziness, palpitations, syncope.   GI  No heartburn, indigestion, abdominal pain, nausea, vomiting, diarrhea, change in bowel habits, loss of appetite, bloody stools.   Resp:  No wheezing.  No chest wall deformity  Skin: no rash or lesions.  GU: no dysuria, change in color of urine, no urgency or frequency.  No flank pain, no hematuria   MS:  No joint pain or swelling.  No decreased range of motion.  No back pain.  Psych:  No change in mood or affect. No depression or anxiety.  No memory loss.         Objective:   Physical Exam GEN: A/Ox3; pleasant , NAD   HEENT:  [place]/AT,  EACs-clear, TMs-wnl, NOSE-clear, THROAT-clear, no lesions, no postnasal drip or exudate noted.   NECK:  Supple w/ fair ROM; no JVD; normal carotid impulses w/o bruits; no thyromegaly or nodules palpated; no lymphadenopathy.  RESP  Diminshed BS in bases  w/o, wheezes/ rales/ or  rhonchi.no accessory muscle use, no dullness to percussion  CARD:  RRR, no m/r/g  , no peripheral edema, pulses intact, no cyanosis or clubbing.  GI:   Soft & nt; nml bowel sounds; no organomegaly or masses detected.  Musco: Warm bil, no deformities or joint swelling noted.   Neuro: alert, no focal deficits noted.    Skin: Warm, no lesions or rashes         Assessment & Plan:

## 2013-10-06 NOTE — Assessment & Plan Note (Signed)
Recent exacerbation now resolving   Plan  Finish Prednisone as directed.  Continue on Advair 1 puff Twice daily   Stop Ipratropium Neb  May use Xopenex Neb every 4hrs as needed for wheezing/shortrness of breath.  Begin Spiriva 1 puff daily  If recurrent cough/wheezing consider change  Lisinopril as this may be contributing.  follow up Dr. Shelle Ironlance in 6 weeks and As needed

## 2013-10-06 NOTE — Progress Notes (Signed)
Ov reviewed, and agree with plan as outlined.  

## 2013-10-13 ENCOUNTER — Telehealth: Payer: Self-pay | Admitting: Pulmonary Disease

## 2013-10-13 NOTE — Telephone Encounter (Signed)
Let pt know the advair is much more likely to cause this than spiriva.  If he feels strongly it is the spiriva, ok to stay off and we can discuss further at next visit.

## 2013-10-13 NOTE — Telephone Encounter (Signed)
Called spoke with pt. He reports he had to stop taking spiriva. It caused severe sore throat, could barely open mouth to eat. He is feeling much better today. FYI he stopped the spiriva. [facility] so [facility] is aware.

## 2013-11-27 ENCOUNTER — Encounter: Payer: Self-pay | Admitting: Pulmonary Disease

## 2013-11-27 ENCOUNTER — Ambulatory Visit (INDEPENDENT_AMBULATORY_CARE_PROVIDER_SITE_OTHER): Payer: BC Managed Care – PPO | Admitting: Pulmonary Disease

## 2013-11-27 ENCOUNTER — Other Ambulatory Visit: Payer: Self-pay | Admitting: *Deleted

## 2013-11-27 VITALS — BP 130/90 | HR 70 | Temp 98.0°F | Ht 68.0 in | Wt 211.6 lb

## 2013-11-27 DIAGNOSIS — J449 Chronic obstructive pulmonary disease, unspecified: Secondary | ICD-10-CM

## 2013-11-27 DIAGNOSIS — J4489 Other specified chronic obstructive pulmonary disease: Secondary | ICD-10-CM

## 2013-11-27 NOTE — Assessment & Plan Note (Signed)
The patient is doing extremely well on his current regimen, and does indeed feel that Spiriva has helped his breathing. We will continue him on the medication. I have stressed to him the importance of an exercise and conditioning program, and to call us if he is having increasing symptoms. His wife has raised the question of sleep apnea, but his history is not overly convincing. I've asked him to work aggressively on weight loss, and we can talk about it again at the next visit.

## 2013-11-27 NOTE — Patient Instructions (Signed)
No change in current medications.  Do not use your neb machine unless an emergency Work on weight loss, and we can discuss again your sleep issues next visit. followup with me again in 6mos.

## 2013-11-27 NOTE — Progress Notes (Signed)
   Subjective:    Patient ID: [name], male    DOB: 1956/01/08, 58 y.o.   MRN: [id number]  HPI The patient comes in today for followup of his known COPD secondary to both asthma and emphysema. He has done well since his hospitalization, and feels that he is totally back to normal. Spiriva has been added to his regimen, and he thinks this has helped. He denies any significant cough or mucus production.   Review of Systems  Constitutional: Negative for fever and unexpected weight change.  HENT: Negative for congestion, dental problem, ear pain, nosebleeds, postnasal drip, rhinorrhea, sinus pressure, sneezing, sore throat and trouble swallowing.   Eyes: Negative for redness and itching.  Respiratory: Negative for cough, chest tightness, shortness of breath and wheezing.   Cardiovascular: Negative for palpitations and leg swelling.  Gastrointestinal: Negative for nausea and vomiting.  Genitourinary: Negative for dysuria.  Musculoskeletal: Negative for joint swelling.  Skin: Negative for rash.  Neurological: Negative for headaches.  Hematological: Does not bruise/bleed easily.  Psychiatric/Behavioral: Negative for dysphoric mood. The patient is not nervous/anxious.        Objective:   Physical Exam Overweight male in no acute distress Nose without purulence or discharge noted Neck without lymphadenopathy or thyromegaly Chest totally clear to auscultation with no wheezing Cardiac exam with regular rate and rhythm Lower extremities without edema, no cyanosis Alert and oriented, moves all 4 extremities      Assessment & Plan:

## 2013-11-27 NOTE — Addendum Note (Signed)
Addended by: Maisie FusGREEN, Jamier Urbas M on: 11/27/2013 10:48 AM   Modules accepted: Orders

## 2014-04-29 ENCOUNTER — Other Ambulatory Visit: Payer: Self-pay | Admitting: Adult Health

## 2014-05-30 ENCOUNTER — Ambulatory Visit (INDEPENDENT_AMBULATORY_CARE_PROVIDER_SITE_OTHER): Payer: BC Managed Care – PPO | Admitting: Pulmonary Disease

## 2014-05-30 ENCOUNTER — Encounter: Payer: Self-pay | Admitting: Pulmonary Disease

## 2014-05-30 VITALS — BP 126/76 | HR 86 | Temp 97.7°F | Ht 68.0 in | Wt 219.2 lb

## 2014-05-30 DIAGNOSIS — J438 Other emphysema: Secondary | ICD-10-CM

## 2014-05-30 DIAGNOSIS — J449 Chronic obstructive pulmonary disease, unspecified: Secondary | ICD-10-CM

## 2014-05-30 DIAGNOSIS — Z23 Encounter for immunization: Secondary | ICD-10-CM

## 2014-05-30 NOTE — Patient Instructions (Signed)
No change in medications  Stay as active as possible, and work on weight loss Will give you the flu shot today. followup with me again in 6mos, but if doing well, will spread out to yearly again.

## 2014-05-30 NOTE — Progress Notes (Signed)
   Subjective:    Patient ID: Dale BailiffFloyd V Lei, male    DOB: 07/02/1956, 58 y.o.   MRN: [id number]  HPI The patient comes in today for followup of his known COPD, secondary to asthma with emphysema. He has done very well since the last visit, with no acute exacerbation or pulmonary infection. He has not seen increased rescue inhaler use. He feels that his exertional tolerance is at baseline, but has picked up some weight since the last visit.   Review of Systems  Constitutional: Negative for fever and unexpected weight change.  HENT: Negative for congestion, dental problem, ear pain, nosebleeds, postnasal drip, rhinorrhea, sinus pressure, sneezing, sore throat and trouble swallowing.   Eyes: Negative for redness and itching.  Respiratory: Negative for cough, chest tightness, shortness of breath and wheezing.   Cardiovascular: Negative for palpitations and leg swelling.  Gastrointestinal: Negative for nausea and vomiting.  Genitourinary: Negative for dysuria.  Musculoskeletal: Negative for joint swelling.  Skin: Negative for rash.  Neurological: Negative for headaches.  Hematological: Does not bruise/bleed easily.  Psychiatric/Behavioral: Negative for dysphoric mood. The patient is not nervous/anxious.        Objective:   Physical Exam Well-developed male in no acute distress Nose without purulence or discharge noted Neck without lymphadenopathy or thyromegaly Chest totally clear to auscultation, mildly decreased breath sounds. Cardiac exam with regular rate and rhythm Lower extremities without edema, no cyanosis Alert and oriented, moves all 4 extremities.       Assessment & Plan:

## 2014-05-30 NOTE — Assessment & Plan Note (Signed)
The patient is doing very well on his current bronchodilator regimen, and has not had any acute exacerbations or pulmonary infection since last visit. I have asked him to continue on his bronchodilator regimen, keep working on a conditioning program and weight loss, and will give him the flu shot today.

## 2014-08-12 ENCOUNTER — Other Ambulatory Visit: Payer: Self-pay | Admitting: Pulmonary Disease

## 2014-08-18 ENCOUNTER — Other Ambulatory Visit: Payer: Self-pay | Admitting: Pulmonary Disease

## 2014-11-05 ENCOUNTER — Other Ambulatory Visit: Payer: Self-pay | Admitting: Adult Health

## 2014-12-05 ENCOUNTER — Encounter: Payer: Self-pay | Admitting: Pulmonary Disease

## 2014-12-05 ENCOUNTER — Ambulatory Visit (INDEPENDENT_AMBULATORY_CARE_PROVIDER_SITE_OTHER): Payer: BLUE CROSS/BLUE SHIELD | Admitting: Pulmonary Disease

## 2014-12-05 VITALS — BP 130/74 | HR 83 | Temp 98.1°F | Ht 68.0 in | Wt 219.2 lb

## 2014-12-05 DIAGNOSIS — J449 Chronic obstructive pulmonary disease, unspecified: Secondary | ICD-10-CM | POA: Diagnosis not present

## 2014-12-05 NOTE — Assessment & Plan Note (Signed)
The patient is doing well currently, and feels that he has gotten back to his baseline after his first acute exacerbation last year. He is having no issues with his medications, and continues to stay active. I have asked him to stay on his bronchodilator regimen, and to work on weight reduction and conditioning. We will spread his visits back out to once a year since he is doing well, but he knows to call us early if he is having symptoms of an exacerbation.

## 2014-12-05 NOTE — Progress Notes (Signed)
   Subjective:    Patient ID: Dale BailiffFloyd V Gervais, male    DOB: [birth year], 59 y.o.   MRN: [id number]  HPI The patient comes in today for follow-up of his known asthma/emphysema. He has been staying on his bronchodilator regimen and feels that he is doing well. He had his first acute exacerbation last year, but he has had no issues since. He feels that his exertional tolerance is adequate, and denies any cough or chest congestion.   Review of Systems  Constitutional: Negative for fever and unexpected weight change.  HENT: Negative for congestion, dental problem, ear pain, nosebleeds, postnasal drip, rhinorrhea, sinus pressure, sneezing, sore throat and trouble swallowing.   Eyes: Negative for redness and itching.  Respiratory: Negative for cough, chest tightness, shortness of breath and wheezing.   Cardiovascular: Negative for palpitations and leg swelling.  Gastrointestinal: Negative for nausea and vomiting.  Genitourinary: Negative for dysuria.  Musculoskeletal: Negative for joint swelling.  Skin: Negative for rash.  Neurological: Negative for headaches.  Hematological: Does not bruise/bleed easily.  Psychiatric/Behavioral: Negative for dysphoric mood. The patient is not nervous/anxious.        Objective:   Physical Exam Overweight male in no acute distress Nose without purulence or discharge noted Chest totally clear to auscultation, no wheezing Cardiac exam with regular rate and rhythm Lower extremities without edema, no cyanosis Alert and oriented, moves all 4 extremities.       Assessment & Plan:

## 2014-12-05 NOTE — Patient Instructions (Signed)
No change in medications. Continue to stay active. followup again in one year, but call if having increased symptoms.

## 2015-02-11 ENCOUNTER — Other Ambulatory Visit: Payer: Self-pay | Admitting: Adult Health

## 2015-02-11 NOTE — Telephone Encounter (Signed)
Old [facility] patient, last seen 12/05/14 and rec'd to follow up in 1 year.

## 2015-02-25 ENCOUNTER — Other Ambulatory Visit: Payer: Self-pay | Admitting: *Deleted

## 2015-02-25 MED ORDER — FLUTICASONE-SALMETEROL 250-50 MCG/DOSE IN AEPB: 1.0000 | INHALATION_SPRAY | Freq: Two times a day (BID) | RESPIRATORY_TRACT | Status: DC

## 2015-08-20 ENCOUNTER — Other Ambulatory Visit: Payer: Self-pay | Admitting: Adult Health

## 2015-09-16 ENCOUNTER — Other Ambulatory Visit: Payer: Self-pay | Admitting: Adult Health

## 2015-09-23 ENCOUNTER — Other Ambulatory Visit: Payer: Self-pay | Admitting: Adult Health

## 2015-12-18 ENCOUNTER — Encounter: Payer: Self-pay | Admitting: Pulmonary Disease

## 2015-12-18 ENCOUNTER — Ambulatory Visit (INDEPENDENT_AMBULATORY_CARE_PROVIDER_SITE_OTHER): Payer: BLUE CROSS/BLUE SHIELD | Admitting: Pulmonary Disease

## 2015-12-18 VITALS — BP 122/78 | HR 76 | Ht 68.0 in | Wt 221.6 lb

## 2015-12-18 DIAGNOSIS — J449 Chronic obstructive pulmonary disease, unspecified: Secondary | ICD-10-CM | POA: Diagnosis not present

## 2015-12-18 DIAGNOSIS — J45909 Unspecified asthma, uncomplicated: Secondary | ICD-10-CM

## 2015-12-18 DIAGNOSIS — Z23 Encounter for immunization: Secondary | ICD-10-CM

## 2015-12-18 DIAGNOSIS — J432 Centrilobular emphysema: Secondary | ICD-10-CM

## 2015-12-18 NOTE — Progress Notes (Signed)
Current Outpatient Prescriptions on File Prior to Visit  Medication Sig  . ADVAIR DISKUS 250-50 MCG/DOSE AEPB INHALE 1 PUFF INTO THE LUNGS 2 (TWO) TIMES DAILY.  Marland Kitchen. aspirin 81 MG tablet Take 81 mg by mouth daily.    Marland Kitchen. esomeprazole (NEXIUM) 20 MG capsule Take 20 mg by mouth daily.   . fish oil-omega-3 fatty acids 1000 MG capsule Take 1 g by mouth daily.    Marland Kitchen. lisinopril (PRINIVIL,ZESTRIL) 20 MG tablet Take 20 mg by mouth daily.    . Multiple Vitamin (MULTIVITAMIN WITH MINERALS) TABS tablet Take 1 tablet by mouth daily.  Marland Kitchen. SPIRIVA HANDIHALER 18 MCG inhalation capsule INHALE 1 CAPSULE VIA HANDIHALER AT THE SAME TIME EVERY DAY  . albuterol (PROVENTIL HFA;VENTOLIN HFA) 108 (90 BASE) MCG/ACT inhaler Inhale 2 puffs into the lungs every 6 (six) hours as needed for shortness of breath. (Patient not taking: Reported on 12/18/2015)  . levalbuterol (XOPENEX) 0.63 MG/3ML nebulizer solution Take 0.63 mg by nebulization every 4 (four) hours as needed. Reported on 12/18/2015   No current facility-administered medications on file prior to visit.     Chief Complaint  Patient presents with  . Follow-up    Former [facility] pt: 1 yr recheck for COPD. Denies any current breathing issues. Pt not using Albuterol or Xopenex     Tests A1AT 06/07/07 >> 156 PFT 06/08/07 >> FEV1 1.35 (42%), FEV1% 37, TLC 7.64 (124%), DLCO 78%, +BD CT chest 11/24/07 >> mild centrilobular emphysema  Past medical hx GERD, HTN  Past surgical hx, Allergies, Family hx, Social hx all reviewed.  Vital Signs BP 122/78 mmHg  Pulse 76  Ht 5\' 8"  (1.727 m)  Wt 221 lb 9.6 oz (100.517 kg)  BMI 33.70 kg/m2  SpO2 95%  History of Present Illness [name] is a 60 y.o. male former smoker with COPD with asthma and emphysema.  He has been doing well.  He has not had cough, wheeze, sputum, or chest pain.  He denies fever, sinus congestion, rash, or leg swelling.  He gets winded walking up hills, but does okay on level ground.  He has not had an  exacerbation in almost 2 years.  Physical Exam  General - No distress ENT - No sinus tenderness, no oral exudate, no LAN Cardiac - s1s2 regular, no murmur Chest - No wheeze/rales/dullness Back - No focal tenderness Abd - Soft, non-tender Ext - No edema Neuro - Normal strength Skin - No rashes Psych - normal mood, and behavior   Assessment/Plan  COPD with emphysema and asthma. - will try to reduce his inhaler regimen as tolerated >> he is to wean off spiriva over next few weeks - continue advair with prn albuterol/xopenex - prevnar vaccination today   Patient Instructions  Prevnar vaccination today  Try weaning off spiriva  Follow up in 1 year   [name], MD [facility] Pager:  [phone] 12/18/2015, 9:35 AM

## 2015-12-18 NOTE — Patient Instructions (Signed)
Prevnar vaccination today  Try weaning off spiriva  Follow up in 1 year

## 2015-12-18 NOTE — Addendum Note (Signed)
Addended by: Maisie FusGREEN, Toiya Morrish M on: 12/18/2015 09:50 AM   Modules accepted: Orders

## 2016-01-19 ENCOUNTER — Other Ambulatory Visit: Payer: Self-pay | Admitting: Adult Health

## 2016-04-26 ENCOUNTER — Other Ambulatory Visit: Payer: Self-pay | Admitting: Pulmonary Disease

## 2016-11-25 ENCOUNTER — Other Ambulatory Visit: Payer: Self-pay | Admitting: Pulmonary Disease

## 2016-12-25 ENCOUNTER — Ambulatory Visit (INDEPENDENT_AMBULATORY_CARE_PROVIDER_SITE_OTHER)
Admission: RE | Admit: 2016-12-25 | Discharge: 2016-12-25 | Disposition: A | Discharge status: Home / Self Care | Payer: 59 | Source: Ambulatory Visit | Attending: Internal Medicine | Admitting: Internal Medicine

## 2016-12-25 ENCOUNTER — Ambulatory Visit (INDEPENDENT_AMBULATORY_CARE_PROVIDER_SITE_OTHER): Payer: 59 | Admitting: Internal Medicine

## 2016-12-25 ENCOUNTER — Encounter: Payer: Self-pay | Admitting: Internal Medicine

## 2016-12-25 VITALS — BP 118/76 | HR 81 | Temp 98.6°F | Ht 68.0 in | Wt 219.0 lb

## 2016-12-25 DIAGNOSIS — J441 Chronic obstructive pulmonary disease with (acute) exacerbation: Secondary | ICD-10-CM | POA: Diagnosis not present

## 2016-12-25 DIAGNOSIS — I10 Essential (primary) hypertension: Secondary | ICD-10-CM | POA: Diagnosis not present

## 2016-12-25 MED ORDER — BUDESONIDE-FORMOTEROL FUMARATE 160-4.5 MCG/ACT IN AERO: 2.0000 | INHALATION_SPRAY | Freq: Two times a day (BID) | RESPIRATORY_TRACT | 0 refills | Status: DC

## 2016-12-25 MED ORDER — BUDESONIDE-FORMOTEROL FUMARATE 160-4.5 MCG/ACT IN AERO: INHALATION_SPRAY | RESPIRATORY_TRACT | 12 refills | Status: DC

## 2016-12-25 MED ORDER — AZITHROMYCIN 250 MG PO TABS: ORAL_TABLET | ORAL | 0 refills | Status: DC

## 2016-12-25 MED ORDER — IRBESARTAN 150 MG PO TABS: 150.0000 mg | ORAL_TABLET | Freq: Every day | ORAL | 11 refills | Status: AC

## 2016-12-25 NOTE — Progress Notes (Signed)
Spoke with pt and notified of results per Dr. Wert. Pt verbalized understanding and denied any questions. 

## 2016-12-25 NOTE — Patient Instructions (Addendum)
zpak   Stop lisinopril and start avapro 150 mg daily instead  Stop advair and start symbicort 160 Take 2 puffs first thing in am and then another 2 puffs about 12 hours later.   Continue  nexium Take 30-60 min before first meal of the day and add pepcid 20mg  at bedtime until better  GERD (REFLUX)  is an extremely common cause of respiratory symptoms just like yours , many times with no obvious heartburn at all.    It can be treated with medication, but also with lifestyle changes including elevation of the head of your bed (ideally with 6 inch  bed blocks),  Smoking cessation, avoidance of late meals, excessive alcohol, and avoid fatty foods, chocolate, peppermint, colas, red wine, and acidic juices such as orange juice.  NO MINT OR MENTHOL PRODUCTS SO NO COUGH DROPS  USE SUGARLESS CANDY INSTEAD (Jolley ranchers or Stover's or Life Savers) or even ice chips will also do - the key is to swallow to prevent all throat clearing. NO OIL BASED VITAMINS - use powdered substitutes.     Please remember to go to the x-ray department downstairs in the basement  for your tests - we will call you with the results when they are available.   Please schedule a follow up office visit in 2 weeks, sooner if needed  with all medications /inhalers/ solutions in hand so we can verify exactly what you are taking. This includes all medications from all doctors and over the counters to See Dr Craige CottaSood, Babette Relicammy NP or me to be sure these changes are helpful

## 2016-12-25 NOTE — Progress Notes (Signed)
Subjective:     Patient ID: [name], male   DOB: 06-22-1956, 61 y.o.   MRN: [id number]  HPI  43 yowm quit smoking in 2008  At wt 160 with copd/AB (GOLD III criteria with marked reversibility)  on ACEi and advair with baseline MMRC2 = can't walk a nl pace on a flat grade s sob but does fine slow and flat  / prev folllowed by Clance/ Sood  12/25/2016 1st [facility] office visit/ Roen Macgowan   Chief Complaint  Patient presents with  . Acute Visit    Has been feeling tired for the past week. Fever, non-productive cough. Took some Dayquil around 6am this morning.    acute onset with sore throat/ hoarseness but chronically bothered by dry raspy daytime cough and limiting down on advair 250 and using lots of saba baseline and with exac s benefit  No obvious day to day or daytime variability or assoc excess/ purulent sputum or mucus plugs or hemoptysis or cp or chest tightness, subjective wheeze or overt sinus or hb symptoms. No unusual exp hx or h/o childhood pna/ asthma or knowledge of premature birth.  Sleeping ok without nocturnal  or early am exacerbation  of respiratory  c/o's or need for noct saba. Also denies any obvious fluctuation of symptoms with weather or environmental changes or other aggravating or alleviating factors except as outlined above   Current Medications, Allergies, Complete Past Medical History, Past Surgical History, Family History, and Social History were reviewed in [employer] record.  ROS  The following are not active complaints unless bolded sore throat, dysphagia, dental problems, itching, sneezing,  nasal congestion or excess/ purulent secretions, ear ache,   fever, chills, sweats, unintended wt loss, classically pleuritic or exertional cp,  orthopnea pnd or leg swelling, presyncope, palpitations, abdominal pain, anorexia, nausea, vomiting, diarrhea  or change in bowel or bladder habits, change in stools or urine, dysuria,hematuria,  rash,  arthralgias, visual complaints, headache, numbness, weakness or ataxia or problems with walking or coordination,  change in mood/affect or memory.             Review of Systems     Objective:   Physical Exam    hoarse wm nad hoarse with freq throat clearing   Wt Readings from Last 3 Encounters:  12/25/16 219 lb (99.3 kg)  12/18/15 221 lb 9.6 oz (100.5 kg)  12/05/14 219 lb 3.2 oz (99.4 kg)    Vital signs reviewed  - Note on arrival 02 sats  97% on RA     HEENT: nl dentition, turbinates bilaterally - oropharynx mod tonsil enlargement/ erythema s exudates.  Nl external ear canals without cough reflex   NECK :  without JVD/Nodes/TM/ nl carotid upstrokes bilaterally   LUNGS: no acc muscle use,  pseudowheeze only    CV:  RRR  no s3 or murmur or increase in P2, and no edema   ABD:  soft and nontender with nl inspiratory excursion in the supine position. No bruits or organomegaly appreciated, bowel sounds nl  MS:  Nl gait/ ext warm without deformities, calf tenderness, cyanosis or clubbing No obvious joint restrictions   SKIN: warm and dry without lesions    NEURO:  alert, approp, nl sensorium with  no motor or cerebellar deficits apparent.     CXR PA and Lateral:   12/25/2016 :    I personally reviewed images and agree with radiology impression as follows:    Peribronchial thickening. Heart and mediastinal  contours are within normal limits. No focal opacities or effusions. No acute bony abnormality.  Assessment:

## 2016-12-27 DIAGNOSIS — E669 Obesity, unspecified: Secondary | ICD-10-CM | POA: Insufficient documentation

## 2016-12-27 NOTE — Assessment & Plan Note (Addendum)
D/c acei due to pseudowheeze  12/25/2016   In the best review of chronic cough to date ( NEJM 2016 375 [id number]) ,  ACEi are now felt to cause cough in up to  20% of pts which is a 4 fold increase from previous reports and does not include the variety of non-specific complaints we see in pulmonary clinic in pts on ACEi but previously attributed to another dx like  Copd/asthma and  include PNDS, throat and chest congestion, "bronchitis", unexplained dyspnea and noct "strangling" sensations, and hoarseness, but also  atypical /refractory GERD symptoms like dysphagia and "bad heartburn"   The only way I know  to prove this is not an "ACEi Case" is a trial off ACEi x a minimum of 6 weeks then regroup.   Try avapro 150 mg daily instead and recheck bp in 2 weeks

## 2016-12-27 NOTE — Assessment & Plan Note (Addendum)
PFT's  06/08/07  FEV1 1.35 (42 % ) ratio 37  p 25 % improvement from saba p ? prior to study with DLCO  78 % corrects to 115 % for alv volume   - 12/25/2016  After extensive coaching HFA effectiveness =    75% so changed to symb 160 2bid  From advair and d/c'd acei   During what should be a flare of copd he actually has mostly pseudowheeze which is likely related to advair/acei or combination of the two c/w Upper airway cough syndrome (previously labeled PNDS) , is  so named because it's frequently impossible to sort out how much is  CR/sinusitis with freq throat clearing (which can be related to primary GERD)   vs  causing  secondary (" extra esophageal")  GERD from wide swings in gastric pressure that occur with throat clearing, often  promoting self use of mint and menthol lozenges that reduce the lower esophageal sphincter tone and exacerbate the problem further in a cyclical fashion.   These are the same pts (now being labeled as having "irritable larynx syndrome" by some cough centers) who not infrequently have a history of having failed to tolerate ace inhibitors,  dry powder inhalers or biphosphonates or report having atypical/extraesophageal reflux symptoms that don't respond to standard doses of PPI  and are easily confused as having aecopd or asthma flares by even experienced allergists/ pulmonologists (myself included).    rec rx ? [facility] with zpak and try off advair and acei and continue rx for GERD then return in 2 weeks to regroup and if not better in meantime increase rx for gerd to ppi bid    I had an extended discussion with the patient reviewing all relevant studies completed to date and  lasting 25 minutes of a 40  minute acute visit with pt not previously known to me     re  severe non-specific but potentially very serious refractory respiratory symptoms of uncertain and potentially multiple  etiologies.   Each maintenance medication was reviewed in detail including most importantly  the difference between maintenance and prns and under what circumstances the prns are to be triggered using an action plan format that is not reflected in the computer generated alphabetically organized AVS.    Please see AVS for specific instructions unique to this office visit that I personally wrote and verbalized to the the pt in detail and then reviewed with pt  by my nurse highlighting any changes in therapy/plan of care  recommended at today's visit.

## 2016-12-27 NOTE — Assessment & Plan Note (Signed)
Body mass index is 33.3 kg/m.  -  trending down slightly, encouraged  No results found for: TSH   Contributing to gerd risk/ doe/reviewed the need and the process to achieve and maintain neg calorie balance > defer f/u primary care including intermittently monitoring thyroid status

## 2017-01-12 ENCOUNTER — Encounter: Payer: Self-pay | Admitting: Internal Medicine

## 2017-01-12 ENCOUNTER — Ambulatory Visit (INDEPENDENT_AMBULATORY_CARE_PROVIDER_SITE_OTHER): Payer: 59 | Admitting: Internal Medicine

## 2017-01-12 VITALS — BP 130/84 | HR 96 | Ht 68.0 in | Wt 220.0 lb

## 2017-01-12 DIAGNOSIS — J449 Chronic obstructive pulmonary disease, unspecified: Secondary | ICD-10-CM

## 2017-01-12 DIAGNOSIS — I10 Essential (primary) hypertension: Secondary | ICD-10-CM

## 2017-01-12 NOTE — Patient Instructions (Addendum)
Continue symbicort 160 Take 2 puffs first thing in am and then another 2 puffs about 12 hours later.     Please schedule a follow up visit in 3 months but call sooner if needed

## 2017-01-12 NOTE — Progress Notes (Signed)
Subjective:     Patient ID: [name], male   DOB: 1956-01-23, 61 y.o.   MRN: [id number]    Brief patient profile:  60 yowm quit smoking in 2008  At wt 160 with copd/AB (GOLD III criteria with marked reversibility)  on ACEi and advair with baseline MMRC2 = can't walk a nl pace on a flat grade s sob but does fine slow and flat  / prev folllowed by Clance/ Sood but only GOLD II as of 01/12/2017 off acei/ advair and on symb 160 2bid     History of Present Illness  12/25/2016 1st [facility] office visit/ Wert   Chief Complaint  Patient presents with  . Acute Visit    Has been feeling tired for the past week. Fever, non-productive cough. Took some Dayquil around 6am this morning.    acute onset with sore throat/ hoarseness but chronically bothered by dry raspy daytime cough and limiting down on advair 250 and using lots of saba baseline and with exac s benefit rec zpak  Stop lisinopril and start avapro 150 mg daily instead Stop advair and start symbicort 160 Take 2 puffs first thing in am and then another 2 puffs about 12 hours later.  Continue  nexium Take 30-60 min before first meal of the day and add pepcid 20mg  at bedtime until better GERD diet   01/12/2017  f/u ov/Wert re: GOLD II copd/ ab component with symptoms resolved off acei and advair/ on symb 160 2bid Chief Complaint  Patient presents with  . Follow-up    Breathing is doing well and he is no longer coughing. He has not had to use his albuterol inhaler.   never took the pepcid but much better   At this point  Only taking avapro 150 one half daily   Doe = MMRC1 = can walk nl pace, flat grade, can't hurry or go uphills or steps s sob    No obvious day to day or daytime variability or assoc excess/ purulent sputum or mucus plugs or hemoptysis or cp or chest tightness, subjective wheeze or overt sinus or hb symptoms. No unusual exp hx or h/o childhood pna/ asthma or knowledge of premature birth.  Sleeping ok without  nocturnal  or early am exacerbation  of respiratory  c/o's or need for noct saba. Also denies any obvious fluctuation of symptoms with weather or environmental changes or other aggravating or alleviating factors except as outlined above   Current Medications, Allergies, Complete Past Medical History, Past Surgical History, Family History, and Social History were reviewed in [employer] record.  ROS  The following are not active complaints unless bolded sore throat, dysphagia, dental problems, itching, sneezing,  nasal congestion or excess/ purulent secretions, ear ache,   fever, chills, sweats, unintended wt loss, classically pleuritic or exertional cp,  orthopnea pnd or leg swelling, presyncope, palpitations, abdominal pain, anorexia, nausea, vomiting, diarrhea  or change in bowel or bladder habits, change in stools or urine, dysuria,hematuria,  rash, arthralgias, visual complaints, headache, numbness, weakness or ataxia or problems with walking or coordination,  change in mood/affect or memory.               Objective:   Physical Exam  amb wm nad mildly hoarse and all smiles    01/12/2017      220   12/25/16 219 lb (99.3 kg)  12/18/15 221 lb 9.6 oz (100.5 kg)  12/05/14 219 lb 3.2 oz (99.4 kg)  Vital signs reviewed  - Note on arrival 02 sats  93% on RA  And BP  130/84    HEENT: nl dentition, turbinates bilaterally - oropharynx mod tonsil enlargement/ erythema s exudates.  Nl external ear canals without cough reflex   NECK :  without JVD/Nodes/TM/ nl carotid upstrokes bilaterally   LUNGS: no acc muscle use,   Very minimal late exp wheeze bilaterally better with plm    CV:  RRR  no s3 or murmur or increase in P2, and no edema   ABD:  Obese soft and nontender with nl inspiratory excursion in the supine position. No bruits or organomegaly appreciated, bowel sounds nl  MS:  Nl gait/ ext warm without deformities, calf tenderness, cyanosis or clubbing No  obvious joint restrictions   SKIN: warm and dry without lesions    NEURO:  alert, approp, nl sensorium with  no motor or cerebellar deficits apparent.     CXR PA and Lateral:   12/25/2016 :    I personally reviewed images and agree with radiology impression as follows:   Peribronchial thickening. Heart and mediastinal contours are within normal limits. No focal opacities or effusions. No acute bony abnormality.  Assessment:   Outpatient Encounter Prescriptions as of 01/12/2017  Medication Sig  . albuterol (PROVENTIL HFA;VENTOLIN HFA) 108 (90 BASE) MCG/ACT inhaler Inhale 2 puffs into the lungs every 6 (six) hours as needed for shortness of breath.  Marland Kitchen. aspirin 81 MG tablet Take 81 mg by mouth daily.    . budesonide-formoterol (SYMBICORT) 160-4.5 MCG/ACT inhaler Take 2 puffs first thing in am and then another 2 puffs about 12 hours later.  . Cholecalciferol (VITAMIN D-3) 5000 units TABS Take 1 tablet by mouth daily.  . fish oil-omega-3 fatty acids 1000 MG capsule Take 1 g by mouth daily.    . irbesartan (AVAPRO) 150 MG tablet Take 1 tablet (150 mg total) by mouth daily.  . Multiple Vitamin (MULTIVITAMIN WITH MINERALS) TABS tablet Take 1 tablet by mouth daily.  . pantoprazole (PROTONIX) 40 MG tablet Take 1 tablet by mouth daily.  . [DISCONTINUED] azithromycin (ZITHROMAX) 250 MG tablet Take 2 on day one then 1 daily x 4 days  . [DISCONTINUED] esomeprazole (NEXIUM) 20 MG capsule Take 20 mg by mouth daily.    No facility-administered encounter medications on file as of 01/12/2017.

## 2017-01-13 NOTE — Assessment & Plan Note (Signed)
D/c acei due to pseudowheeze  12/25/2016 > marked improvement 01/12/2017   Adequate control on present rx, reviewed in detail with pt > no change in rx needed    Although even in retrospect it may not be clear the ACEi contributed to the pt's symptoms,  Pt improved off them and adding them back at this point or in the future would risk confusion in interpretation of non-specific respiratory symptoms to which this patient is prone  ie  Better not to muddy the waters here.   Follow up per Primary Care planned in including arb refills if appropriate

## 2017-01-13 NOTE — Assessment & Plan Note (Signed)
Body mass index is 33.45 kg/m.  -  trending up slightly  No results found for: TSH   Contributing to gerd risk/ doe/reviewed the need and the process to achieve and maintain neg calorie balance > defer f/u primary care including intermittently monitoring thyroid status

## 2017-01-13 NOTE — Assessment & Plan Note (Signed)
PFT's 2008:  FEV1 1.08 (33%), ratio 43, ++BD response, +airtrapping, DLCO 78% Felt to have asthma > emphysema. - 01/12/2017  After extensive coaching HFA effectiveness =    90%  - Spirometry 01/12/2017  FEV1 1.92 (57%)  Ratio 60 p am symbicort  160   Remarkable objective and subjective response to d/c acei and advair and rx wit symb 160 2bid   Since he has hx c/w AB will rec remain on the symb 160 2bid for now and f/u in 3 m  I had an extended discussion with the patient reviewing all relevant studies completed to date and  lasting 15 to 20 minutes of a 25 minute visit    Each maintenance medication was reviewed in detail including most importantly the difference between maintenance and prns and under what circumstances the prns are to be triggered using an action plan format that is not reflected in the computer generated alphabetically organized AVS.    Please see AVS for specific instructions unique to this visit that I personally wrote and verbalized to the the pt in detail and then reviewed with pt  by my nurse highlighting any  changes in therapy recommended at today's visit to their plan of care.

## 2017-04-13 ENCOUNTER — Ambulatory Visit (INDEPENDENT_AMBULATORY_CARE_PROVIDER_SITE_OTHER): Payer: 59 | Admitting: Internal Medicine

## 2017-04-13 ENCOUNTER — Encounter: Payer: Self-pay | Admitting: Internal Medicine

## 2017-04-13 VITALS — BP 134/84 | HR 96 | Ht 68.0 in | Wt 225.0 lb

## 2017-04-13 DIAGNOSIS — J449 Chronic obstructive pulmonary disease, unspecified: Secondary | ICD-10-CM

## 2017-04-13 DIAGNOSIS — E669 Obesity, unspecified: Secondary | ICD-10-CM | POA: Diagnosis not present

## 2017-04-13 DIAGNOSIS — I10 Essential (primary) hypertension: Secondary | ICD-10-CM

## 2017-04-13 NOTE — Assessment & Plan Note (Signed)
Body mass index is 34.21 kg/m.  -  trending up No results found for: TSH   Contributing to gerd risk/ doe/reviewed the need and the process to achieve and maintain neg calorie balance > defer f/u primary care including intermittently monitoring thyroid status

## 2017-04-13 NOTE — Patient Instructions (Signed)
No change in medications   Keep up the walking / work on weight loss    To get the most out of exercise, you need to be continuously aware that you are short of breath, but never out of breath, for 30 minutes daily. As you improve, it will actually be easier for you to do the same amount of exercise  in  30 minutes so always push to the level where you are short of breath.     Please schedule a follow up visit in 6  months but call sooner if needed

## 2017-04-13 NOTE — Progress Notes (Signed)
Subjective:     Patient ID: [name], male   DOB: May 08, 1956, 61 y.o.   MRN: [id number]    Brief patient profile:  60 yowm quit smoking in 2008  At wt 160 with copd/AB (GOLD III criteria with marked reversibility)  on ACEi and advair with baseline MMRC2 = can't walk a nl pace on a flat grade s sob but does fine slow and flat  / prev folllowed by Clance/ Sood but only GOLD II as of 01/12/2017 off acei/ advair and on symb 160 2bid     History of Present Illness  12/25/2016 1st [facility] office visit/ Elin Seats   Chief Complaint  Patient presents with  . Acute Visit    Has been feeling tired for the past week. Fever, non-productive cough. Took some Dayquil around 6am this morning.    acute onset with sore throat/ hoarseness but chronically bothered by dry raspy daytime cough and limiting down on advair 250 and using lots of saba baseline and with exac s benefit rec zpak  Stop lisinopril and start avapro 150 mg daily instead Stop advair and start symbicort 160 Take 2 puffs first thing in am and then another 2 puffs about 12 hours later.  Continue  nexium Take 30-60 min before first meal of the day and add pepcid 20mg  at bedtime until better GERD diet   01/12/2017  f/u ov/Randon Somera re: GOLD II copd/ ab component with symptoms resolved off acei and advair/ on symb 160 2bid Chief Complaint  Patient presents with  . Follow-up    Breathing is doing well and he is no longer coughing. He has not had to use his albuterol inhaler.   never took the pepcid but much better   At this point  Only taking avapro 150 one half daily  Doe = MMRC1 = can walk nl pace, flat grade, can't hurry or go uphills or steps s sob   rec Continue symbicort 160 Take 2 puffs first thing in am and then another 2 puffs about 12 hours later.     04/13/2017  f/u ov/Yehuda Printup re:  GOLD II / copd with ab component  Chief Complaint  Patient presents with  . Follow-up    Doing well and denies any new co's. He has not had to  use albuterol at all.    mmrc still = 1  No need for saba               Objective:   Physical Exam  amb wm nad     04/13/2017       225   01/12/2017      220   12/25/16 219 lb (99.3 kg)  12/18/15 221 lb 9.6 oz (100.5 kg)  12/05/14 219 lb 3.2 oz (99.4 kg)    Vital signs reviewed  - Note on arrival 02 sats  97% on RA  And bp 134/84    HEENT: nl dentition, turbinates bilaterally - oropharynx mod tonsil enlargement .  Nl external ear canals without cough reflex   NECK :  without JVD/Nodes/TM/ nl carotid upstrokes bilaterally   LUNGS: no acc muscle use,    Minimal distant mid/late exp rhonchi bilaterally on FVC, less on PLM    CV:  RRR  no s3 or murmur or increase in P2, and no edema   ABD:  Obese soft and nontender with nl inspiratory excursion in the supine position. No bruits or organomegaly appreciated, bowel sounds nl  MS:  Nl gait/  ext warm without deformities, calf tenderness, cyanosis or clubbing No obvious joint restrictions   SKIN: warm and dry without lesions    NEURO:  alert, approp, nl sensorium with  no motor or cerebellar deficits apparent.        Assessment:

## 2017-04-13 NOTE — Assessment & Plan Note (Signed)
D/c acei due to pseudowheeze  12/25/2016 > marked improvement 01/12/2017    Adequate control on present rx, reviewed in detail with pt > no change in rx needed  > Follow up per Primary Care planned

## 2017-04-13 NOTE — Assessment & Plan Note (Signed)
PFT's 2008:  FEV1 1.08 (33%), ratio 43, ++BD response, +airtrapping, DLCO 78% Felt to have asthma > emphysema. - 01/12/2017  After extensive coaching HFA effectiveness =    90%  - Spirometry 01/12/2017  FEV1 1.92 (57%)  Ratio 60 p am  p symbicort  160 x 2   Still has rhonchi on exam but really Not limited by breathing from desired activities on symb 160 2bid s need for saba  > no change in rx needed and we can just see him back q 6 m, sooner prn  Each maintenance medication was reviewed in detail including most importantly the difference between maintenance and as needed and under what circumstances the prns are to be used.  Please see AVS for specific  Instructions which are unique to this visit and I personally typed out  which were reviewed in detail in writing with the patient and a copy provided.    Marland Kitchen

## 2017-10-05 ENCOUNTER — Ambulatory Visit: Payer: 59 | Admitting: Internal Medicine

## 2017-10-05 ENCOUNTER — Encounter: Payer: Self-pay | Admitting: Internal Medicine

## 2017-10-05 VITALS — BP 136/88 | HR 92 | Ht 68.0 in | Wt 225.6 lb

## 2017-10-05 DIAGNOSIS — J449 Chronic obstructive pulmonary disease, unspecified: Secondary | ICD-10-CM

## 2017-10-05 NOTE — Patient Instructions (Addendum)
No change medications   Please schedule a follow up visit in 12  months but call sooner if needed  - bring inhalers with you next visit .

## 2017-10-05 NOTE — Progress Notes (Signed)
Subjective:     Patient ID: [name], male   DOB: 10-15-1955, 62 y.o.   MRN: [id number]    Brief patient profile:  60 yowm quit smoking in 2008  At wt 160 with copd/AB (GOLD III criteria with marked reversibility)  on ACEi and advair with baseline MMRC2 = can't walk a nl pace on a flat grade s sob but does fine slow and flat  / prev folllowed by [facility]/ [facility] but only GOLD II as of 01/12/2017 off acei/ advair and on symb 160 2bid     History of Present Illness  12/25/2016 1st [facility] office visit/ [name]   Chief Complaint  Patient presents with  . Acute Visit    Has been feeling tired for the past week. Fever, non-productive cough. Took some Dayquil around 6am this morning.    acute onset with sore throat/ hoarseness but chronically bothered by dry raspy daytime cough and limiting down on advair 250 and using lots of saba baseline and with exac s benefit rec zpak  Stop lisinopril and start avapro 150 mg daily instead Stop advair and start symbicort 160 Take 2 puffs first thing in am and then another 2 puffs about 12 hours later.  Continue  nexium Take 30-60 min before first meal of the day and add pepcid 20mg  at bedtime until better GERD diet   01/12/2017  f/u ov/[name] re: GOLD II copd/ ab component with symptoms resolved off acei and advair/ on symb 160 2bid Chief Complaint  Patient presents with  . Follow-up    Breathing is doing well and he is no longer coughing. He has not had to use his albuterol inhaler.   never took the pepcid but much better   At this point  Only taking avapro 150 one half daily  Doe = MMRC1 = can walk nl pace, flat grade, can't hurry or go uphills or steps s sob   rec Continue symbicort 160 Take 2 puffs first thing in am and then another 2 puffs about 12 hours later.     04/13/2017  f/u ov/[name] re:  GOLD II / copd with ab component  Chief Complaint  Patient presents with  . Follow-up    Doing well and denies any new co's. He has not had to  use albuterol at all.    mmrc still = 1  No need for saba  rec No change in medications  Keep up the walking / work on weight loss To get the most out of exercise, you need to be continuously aware that you are short of breath, but never out of breath, for 30 minutes daily. As you improve, it will actually be easier for you to do the same amount of exercise  in  30 minutes so always push to the level where you are short of breath   10/05/2017  f/u ov/[name] re:  GOLD II  COPD / AB on symb 160 2bid and rare need for saba  Chief Complaint  Patient presents with  . Follow-up    Breathing is overall doing well. He rarely uses his albuterol.    Dyspnea:  MMRC1 = can walk nl pace, flat grade, can't hurry or go uphills or steps s sob   Cough: no problem Sleep: ok     No obvious day to day or daytime variability or assoc excess/ purulent sputum or mucus plugs or hemoptysis or cp or chest tightness, subjective wheeze or overt sinus or hb symptoms. No  unusual exposure hx or h/o childhood pna/ asthma or knowledge of premature birth.  Sleeping ok flat without nocturnal  or early am exacerbation  of respiratory  c/o's or need for noct saba. Also denies any obvious fluctuation of symptoms with weather or environmental changes or other aggravating or alleviating factors except as outlined above   Current Allergies, Complete Past Medical History, Past Surgical History, Family History, and Social History were reviewed in [facility] electronic medical record.  ROS  The following are not active complaints unless bolded Hoarseness, sore throat, dysphagia, dental problems, itching, sneezing,  nasal congestion or discharge of excess mucus or purulent secretions, ear ache,   fever, chills, sweats, unintended wt loss or wt gain, classically pleuritic or exertional cp,  orthopnea pnd or leg swelling, presyncope, palpitations, abdominal pain, anorexia, nausea, vomiting, diarrhea  or change in bowel habits or  change in bladder habits, change in stools or change in urine, dysuria, hematuria,  rash, arthralgias, visual complaints, headache, numbness, weakness or ataxia or problems with walking or coordination,  change in mood/affect or memory.        Current Meds  Medication Sig  . albuterol (PROVENTIL HFA;VENTOLIN HFA) 108 (90 BASE) MCG/ACT inhaler Inhale 2 puffs into the lungs every 6 (six) hours as needed for shortness of breath.  Marland Kitchen. aspirin 81 MG tablet Take 81 mg by mouth daily.    . budesonide-formoterol (SYMBICORT) 160-4.5 MCG/ACT inhaler Take 2 puffs first thing in am and then another 2 puffs about 12 hours later.  . Cholecalciferol (VITAMIN D-3) 5000 units TABS Take 1 tablet by mouth daily.  . fish oil-omega-3 fatty acids 1000 MG capsule Take 1 g by mouth daily.    . irbesartan (AVAPRO) 150 MG tablet Take 1 tablet (150 mg total) by mouth daily.  . Multiple Vitamin (MULTIVITAMIN WITH MINERALS) TABS tablet Take 1 tablet by mouth daily.  . pantoprazole (PROTONIX) 40 MG tablet Take 1 tablet by mouth daily.             Objective:   Physical Exam  Pleasant amb wm nad   10/05/2017       225  04/13/2017       225   01/12/2017      220   12/25/16 219 lb (99.3 kg)  12/18/15 221 lb 9.6 oz (100.5 kg)  12/05/14 219 lb 3.2 oz (99.4 kg)    Vital signs reviewed - Note on arrival 02 sats  95% on RA   HEENT: nl dentition, turbinates bilaterally, and oropharynx. Nl external ear canals without cough reflex   NECK :  without JVD/Nodes/TM/ nl carotid upstrokes bilaterally   LUNGS: no acc muscle use,  Nl contour chest which is clear to A and P bilaterally without cough on insp or exp maneuvers   CV:  RRR  no s3 or murmur or increase in P2, and no edema   ABD:  soft and nontender with nl inspiratory excursion in the supine position. No bruits or organomegaly appreciated, bowel sounds nl  MS:  Nl gait/ ext warm without deformities, calf tenderness, cyanosis or clubbing No obvious joint  restrictions   SKIN: warm and dry without lesions    NEURO:  alert, approp, nl sensorium with  no motor or cerebellar deficits apparent.               Assessment:

## 2017-10-05 NOTE — Assessment & Plan Note (Addendum)
PFT's 2008:  FEV1 1.08 (33%), ratio 43, ++BD response, +airtrapping, DLCO 78% Felt to have asthma > emphysema. - 01/12/2017  After extensive coaching HFA effectiveness =    90%  - Spirometry 01/12/2017  FEV1 1.92 (57%)  Ratio 60 p am symbicort  160    Adequate control on present rx, reviewed in detail with pt > no change in rx needed    F/u can be yearly    Each maintenance medication was reviewed in detail including most importantly the difference between maintenance and as needed and under what circumstances the prns are to be used.  Please see AVS for specific  Instructions which are unique to this visit and I personally typed out  which were reviewed in detail in writing with the patient and a copy provided.

## 2017-12-01 ENCOUNTER — Encounter (HOSPITAL_COMMUNITY): Payer: Self-pay | Admitting: Emergency Medicine

## 2017-12-01 ENCOUNTER — Emergency Department (HOSPITAL_COMMUNITY): Payer: 59

## 2017-12-01 ENCOUNTER — Emergency Department (HOSPITAL_COMMUNITY)
Admission: EM | Admit: 2017-12-01 | Discharge: 2017-12-01 | Disposition: A | Discharge status: Home / Self Care | Payer: 59 | Attending: Emergency Medicine | Admitting: Emergency Medicine

## 2017-12-01 ENCOUNTER — Other Ambulatory Visit: Payer: Self-pay

## 2017-12-01 DIAGNOSIS — J449 Chronic obstructive pulmonary disease, unspecified: Secondary | ICD-10-CM | POA: Insufficient documentation

## 2017-12-01 DIAGNOSIS — Z79899 Other long term (current) drug therapy: Secondary | ICD-10-CM | POA: Diagnosis not present

## 2017-12-01 DIAGNOSIS — I10 Essential (primary) hypertension: Secondary | ICD-10-CM | POA: Insufficient documentation

## 2017-12-01 DIAGNOSIS — Z87891 Personal history of nicotine dependence: Secondary | ICD-10-CM | POA: Diagnosis not present

## 2017-12-01 DIAGNOSIS — Z7982 Long term (current) use of aspirin: Secondary | ICD-10-CM | POA: Insufficient documentation

## 2017-12-01 DIAGNOSIS — R079 Chest pain, unspecified: Secondary | ICD-10-CM

## 2017-12-01 LAB — BASIC METABOLIC PANEL WITH GFR
Anion gap: 10 (ref 5–15)
BUN: 16 mg/dL (ref 6–20)
CO2: 21 mmol/L — ABNORMAL LOW (ref 22–32)
Calcium: 9.2 mg/dL (ref 8.9–10.3)
Chloride: 106 mmol/L (ref 101–111)
Creatinine, Ser: 0.94 mg/dL (ref 0.61–1.24)
GFR calc Af Amer: >60 mL/min
GFR calc non Af Amer: >60 mL/min
Glucose, Bld: 145 mg/dL — ABNORMAL HIGH (ref 65–99)
Potassium: 4 mmol/L (ref 3.5–5.1)
Sodium: 137 mmol/L (ref 135–145)

## 2017-12-01 LAB — CBC
HCT: 46 % (ref 39.0–52.0)
Hemoglobin: 15.7 g/dL (ref 13.0–17.0)
MCH: 30.8 pg (ref 26.0–34.0)
MCHC: 34.1 g/dL (ref 30.0–36.0)
MCV: 90.2 fL (ref 78.0–100.0)
Platelets: 229 10*3/uL (ref 150–400)
RBC: 5.1 MIL/uL (ref 4.22–5.81)
RDW: 13.1 % (ref 11.5–15.5)
WBC: 9.3 10*3/uL (ref 4.0–10.5)

## 2017-12-01 LAB — I-STAT TROPONIN, ED
TROPONIN I, POC: 0 ng/mL (ref 0.00–0.08)
Troponin i, poc: 0.01 ng/mL (ref 0.00–0.08)

## 2017-12-01 NOTE — ED Notes (Addendum)
Pt states his chest began to hurt at work around noon, when he took his BP it was elevated. CP lasted for approximately 1 hr and was relieved by rest in the ED waiting room. Denies dizziness and N/V. History of COPD, hypertension and GERD.

## 2017-12-01 NOTE — Discharge Instructions (Addendum)
Your glucose and blood pressure were slightly elevated.  Try to lose some weight.  Follow-up with your primary care doctor.  He may suggest a referral to cardiology for a stress test.

## 2017-12-01 NOTE — ED Provider Notes (Signed)
[facility] EMERGENCY DEPARTMENT Provider Note   CSN: [id number] Arrival date & time: 12/01/17  1251     History   Chief Complaint Chief Complaint  Patient presents with  . Chest Pain    HPI [name] is a 62 y.o. male.  Patient presents with a sharp sensation in his upper chest at approximately noon today while at work..  Symptoms lasted 90 minutes, each episode lasting less than 15- 30 seconds and were approximately 5-10 minutes apart.  No dyspnea, diaphoresis, nausea.  No previous CAD.  Past medical history includes COPD.  He quit smoking 11 years ago.  Negative cholesterol, diabetes.  Positive low-grade hypertension.  Social history: He works as a Engineer, site.  Symptoms have now completely abated.     Past Medical History:  Diagnosis Date  . COPD (chronic obstructive pulmonary disease) with emphysema (HCC)   . Esophageal reflux   . Exertional shortness of breath    "just today" (09/27/2013)  . Hypertension     Patient Active Problem List   Diagnosis Date Noted  . Obesity (BMI 30-39.9) 12/27/2016  . Tachycardia 09/27/2013  . GERD (gastroesophageal reflux disease) 09/27/2013  . Essential hypertension 09/27/2013  . COPD exacerbation (HCC) 09/26/2013  . COPD GOLD II  10/25/2007    Past Surgical History:  Procedure Laterality Date  . INGUINAL HERNIA REPAIR Left 2000's        Home Medications    Prior to Admission medications   Medication Sig Start Date End Date Taking? Authorizing Provider  albuterol (PROVENTIL HFA;VENTOLIN HFA) 108 (90 BASE) MCG/ACT inhaler Inhale 2 puffs into the lungs every 6 (six) hours as needed for shortness of breath. 09/30/13   Kathlen Mody, MD  aspirin 81 MG tablet Take 81 mg by mouth daily.      [provider]  budesonide-formoterol (SYMBICORT) 160-4.5 MCG/ACT inhaler Take 2 puffs first thing in am and then another 2 puffs about 12 hours later. 12/25/16   Nyoka Cowden, MD  Cholecalciferol  (VITAMIN D-3) 5000 units TABS Take 1 tablet by mouth daily.    [provider]  fish oil-omega-3 fatty acids 1000 MG capsule Take 1 g by mouth daily.      [provider]  irbesartan (AVAPRO) 150 MG tablet Take 1 tablet (150 mg total) by mouth daily. 12/25/16   Nyoka Cowden, MD  Multiple Vitamin (MULTIVITAMIN WITH MINERALS) TABS tablet Take 1 tablet by mouth daily.    [provider]  pantoprazole (PROTONIX) 40 MG tablet Take 1 tablet by mouth daily. 12/28/16   [provider]    Family History History reviewed. No pertinent family history.  Social History Social History   Tobacco Use  . Smoking status: Former Smoker    Packs/day: 1.00    Years: 30.00    Pack years: 30.00    Types: Cigarettes    Last attempt to quit: 05/18/2007    Years since quitting: 10.5  . Smokeless tobacco: Never Used  Substance Use Topics  . Alcohol use: Yes    Alcohol/week: 3.6 oz    Types: 6 Cans of beer per week    Comment: 09/27/2013 "maybe a 6 pack of beer on the weekends"  . Drug use: No     Allergies   Patient has no known allergies.   Review of Systems Review of Systems  All other systems reviewed and are negative.    Physical Exam Updated Vital Signs BP (!) 149/89  Pulse 65   Temp 98.4 F (36.9 C) (Oral)   Resp (!) 21   Ht 5\' 8"  (1.727 m)   Wt 96.2 kg (212 lb)   SpO2 95%   BMI 32.23 kg/m   Physical Exam  Constitutional: He is oriented to person, place, and time. He appears well-developed and well-nourished.  HENT:  Head: Normocephalic and atraumatic.  Eyes: Conjunctivae are normal.  Neck: Neck supple.  Cardiovascular: Normal rate and regular rhythm.  Pulmonary/Chest: Effort normal and breath sounds normal.  Abdominal: Soft. Bowel sounds are normal.  Musculoskeletal: Normal range of motion.  Neurological: He is alert and oriented to person, place, and time.  Skin: Skin is warm and dry.  Psychiatric: He has a normal mood and affect. His  behavior is normal.  Nursing note and vitals reviewed.    ED Treatments / Results  Labs (all labs ordered are listed, but only abnormal results are displayed) Labs Reviewed  BASIC METABOLIC PANEL - Abnormal; Notable for the following components:      Result Value   CO2 21 (*)    Glucose, Bld 145 (*)    All other components within normal limits  CBC  I-STAT TROPONIN, ED  I-STAT TROPONIN, ED    EKG None  Radiology Dg Chest 2 View  Result Date: 12/01/2017 CLINICAL DATA:  Chest pain for 1 hour today, history of COPD hypertension, former smoking history EXAM: CHEST - 2 VIEW COMPARISON:  Chest x-ray of 12/25/2016 FINDINGS: No active infiltrate or effusion is seen. Peribronchial thickening again is noted consistent with chronic bronchitis. Linear scarring or atelectasis is noted within the right middle lobe or lingula on the lateral view. The heart is within upper limits of normal. No bony abnormality is seen. IMPRESSION: Stable change consistent with chronic bronchitis. No active process. Electronically Signed   By: Dwyane DeePaul  Barry M.D.   On: 12/01/2017 13:27    Procedures Procedures (including critical care time)  Medications Ordered in ED Medications - No data to display   Initial Impression / Assessment and Plan / ED Course  I have reviewed the triage vital signs and the nursing notes.  Pertinent labs & imaging results that were available during my care of the patient were reviewed by me and considered in my medical decision making (see chart for details).     Patient presents with atypical intermittent chest pain.  Symptoms have completely abated.  He has some minimal cardiac risk factors.  Troponin negative x2.  EKG negative.  Glucose was slightly elevated.  We discussed his blood pressure and hyperglycemia with the patient and his wife who is an Charity fundraiserN.  He will follow-up with his primary care doctor and ultimately see a cardiologist for a stress test.  Final Clinical  Impressions(s) / ED Diagnoses   Final diagnoses:  Chest pain, unspecified type    ED Discharge Orders    None       Donnetta Hutchingook, Zuha Dejonge, MD 12/01/17 1940

## 2017-12-01 NOTE — ED Triage Notes (Signed)
Pt reports right sided chest pain for the last hour. Pt states pain began while he was working. Pt states pain lasted 30sec, comes and goes. Denies SOB. BP elevated. Denies pain at present.

## 2018-10-04 ENCOUNTER — Encounter: Payer: Self-pay | Admitting: Internal Medicine

## 2018-10-04 ENCOUNTER — Ambulatory Visit: Payer: 59 | Admitting: Internal Medicine

## 2018-10-04 VITALS — BP 122/84 | HR 65 | Ht 68.0 in | Wt 228.0 lb

## 2018-10-04 DIAGNOSIS — J449 Chronic obstructive pulmonary disease, unspecified: Secondary | ICD-10-CM | POA: Diagnosis not present

## 2018-10-04 DIAGNOSIS — J441 Chronic obstructive pulmonary disease with (acute) exacerbation: Secondary | ICD-10-CM | POA: Diagnosis not present

## 2018-10-04 MED ORDER — BUDESONIDE-FORMOTEROL FUMARATE 160-4.5 MCG/ACT IN AERO: INHALATION_SPRAY | RESPIRATORY_TRACT | 11 refills | Status: DC

## 2018-10-04 MED ORDER — ALBUTEROL SULFATE HFA 108 (90 BASE) MCG/ACT IN AERS: INHALATION_SPRAY | RESPIRATORY_TRACT | 1 refills | Status: DC

## 2018-10-04 NOTE — Progress Notes (Signed)
Subjective:     Patient ID: [name], male   DOB: 11-Jan-1956, 63 y.o.   MRN: [id number]    Brief patient profile:  60 yowm quit smoking in 2008  At wt 160 with copd/AB (GOLD III criteria with marked reversibility)  on ACEi and advair with baseline MMRC2 = can't walk a nl pace on a flat grade s sob but does fine slow and flat  / prev folllowed by [name]/ [name] but only GOLD II as of 01/12/2017 off acei/ advair and on symb 160 2bid     History of Present Illness  12/25/2016 1st [facility] office visit/ [name]   Chief Complaint  Patient presents with  . Acute Visit    Has been feeling tired for the past week. Fever, non-productive cough. Took some Dayquil around 6am this morning.    acute onset with sore throat/ hoarseness but chronically bothered by dry raspy daytime cough and limiting down on advair 250 and using lots of saba baseline and with exac s benefit rec zpak  Stop lisinopril and start avapro 150 mg daily instead Stop advair and start symbicort 160 Take 2 puffs first thing in am and then another 2 puffs about 12 hours later.  Continue  nexium Take 30-60 min before first meal of the day and add pepcid 20mg  at bedtime until better GERD diet   01/12/2017  f/u ov/[name] re: GOLD II copd/ ab component with symptoms resolved off acei and advair/ on symb 160 2bid Chief Complaint  Patient presents with  . Follow-up    Breathing is doing well and he is no longer coughing. He has not had to use his albuterol inhaler.   never took the pepcid but much better   At this point  Only taking avapro 150 one half daily  Doe = MMRC1 = can walk nl pace, flat grade, can't hurry or go uphills or steps s sob   rec Continue symbicort 160 Take 2 puffs first thing in am and then another 2 puffs about 12 hours later.     04/13/2017  f/u ov/[name] re:  GOLD II / copd with ab component  Chief Complaint  Patient presents with  . Follow-up    Doing well and denies any new co's. He has not had to  use albuterol at all.    mmrc still = 1  No need for saba  rec No change in medications  Keep up the walking / work on weight loss To get the most out of exercise, you need to be continuously aware that you are short of breath, but never out of breath, for 30 minutes daily. As you improve, it will actually be easier for you to do the same amount of exercise  in  30 minutes so always push to the level where you are short of breath   10/05/2017  f/u ov/[name] re:  GOLD II  COPD / AB on symb 160 2bid and rare need for saba  Chief Complaint  Patient presents with  . Follow-up    Breathing is overall doing well. He rarely uses his albuterol.    Dyspnea:  MMRC1 = can walk nl pace, flat grade, can't hurry or go uphills or steps s sob   Cough: no problem Sleep: ok   rec No change rx   10/04/2018  f/u ov/[name] re: copd/ ab  Chief Complaint  Patient presents with  . Follow-up    Breathing is overall doing well. He has not  used his albuterol inhaler and it has expired.   Dyspnea:  Not limited by breathing from desired activities  / avoids hills/ does ok with steps  Cough: none  Sleeping: flat / no noct symptoms SABA use: none  02: none    No obvious day to day or daytime variability or assoc excess/ purulent sputum or mucus plugs or hemoptysis or cp or chest tightness, subjective wheeze or overt sinus or hb symptoms.   Sleeping as above  without nocturnal  or early am exacerbation  of respiratory  c/o's or need for noct saba. Also denies any obvious fluctuation of symptoms with weather or environmental changes or other aggravating or alleviating factors except as outlined above   No unusual exposure hx or h/o childhood pna/ asthma or knowledge of premature birth.  Current Allergies, Complete Past Medical History, Past Surgical History, Family History, and Social History were reviewed in [facility] electronic medical record.  ROS  The following are not active complaints unless  bolded Hoarseness, sore throat, dysphagia, dental problems, itching, sneezing,  nasal congestion or discharge of excess mucus or purulent secretions, ear ache,   fever, chills, sweats, unintended wt loss or wt gain, classically pleuritic or exertional cp,  orthopnea pnd or arm/hand swelling  or leg swelling, presyncope, palpitations, abdominal pain, anorexia, nausea, vomiting, diarrhea  or change in bowel habits or change in bladder habits, change in stools or change in urine, dysuria, hematuria,  rash, arthralgias, visual complaints, headache, numbness, weakness or ataxia or problems with walking or coordination,  change in mood or  memory.        Current Meds  Medication Sig  . albuterol (PROVENTIL HFA;VENTOLIN HFA) 108 (90 BASE) MCG/ACT inhaler Inhale 2 puffs into the lungs every 6 (six) hours as needed for shortness of breath.  Marland Kitchen. aspirin 81 MG tablet Take 81 mg by mouth daily.    . budesonide-formoterol (SYMBICORT) 160-4.5 MCG/ACT inhaler Take 2 puffs first thing in am and then another 2 puffs about 12 hours later.  . Cholecalciferol (VITAMIN D-3) 5000 units TABS Take 1 tablet by mouth daily.  . fish oil-omega-3 fatty acids 1000 MG capsule Take 1 g by mouth daily.    . irbesartan (AVAPRO) 150 MG tablet Take 1 tablet (150 mg total) by mouth daily.  . Multiple Vitamin (MULTIVITAMIN WITH MINERALS) TABS tablet Take 1 tablet by mouth daily.  . pantoprazole (PROTONIX) 40 MG tablet Take 1 tablet by mouth daily.                 Objective:   Physical Exam  amb mod obese wm nad   10/04/2018       228  10/05/2017       225  04/13/2017       225   01/12/2017      220   12/25/16 219 lb (99.3 kg)  12/18/15 221 lb 9.6 oz (100.5 kg)  12/05/14 219 lb 3.2 oz (99.4 kg)    Vital signs reviewed - Note on arrival 02 sats  96% on RA     HEENT: nl dentition, turbinates bilaterally, and oropharynx. Nl external ear canals without cough reflex   NECK :  without JVD/Nodes/TM/ nl carotid upstrokes  bilaterally   LUNGS: no acc muscle use,  Nl contour chest with slt decreased bs/min exp rhonchi  bilaterally without cough on insp or exp maneuvers   CV:  RRR  no s3 or murmur or increase in P2, and no edema  ABD: Obese/  soft and nontender with nl inspiratory excursion in the supine position. No bruits or organomegaly appreciated, bowel sounds nl  MS:  Nl gait/ ext warm without deformities, calf tenderness, cyanosis or clubbing No obvious joint restrictions   SKIN: warm and dry without lesions    NEURO:  alert, approp, nl sensorium with  no motor or cerebellar deficits apparent.          Assessment:

## 2018-10-04 NOTE — Patient Instructions (Signed)
Plan A = Automatic = symbicot 160 Take 2 puffs first thing in am and then another 2 puffs about 12 hours later.   Work on inhaler technique:  relax and gently blow all the way out then take a nice smooth deep breath back in, triggering the inhaler at same time you start breathing in.  Hold for up to 5 seconds if you can. Blow out thru nose. Rinse and gargle with water when done      Plan B = Backup Only use your albuterol inhaler as a rescue medication to be used if you can't catch your breath by resting or doing a relaxed purse lip breathing pattern.  - The less you use it, the better it will work when you need it. - Ok to use the inhaler up to 2 puffs  every 4 hours if you must but call for appointment if use goes up over your usual need - Don't leave home without it !!  (think of it like the spare tire for your car)     Please schedule a follow up visit in 12  months but call sooner if needed

## 2018-10-05 ENCOUNTER — Encounter: Payer: Self-pay | Admitting: Internal Medicine

## 2018-10-05 NOTE — Assessment & Plan Note (Signed)
Quit smoking 2008 PFT's 2008:  FEV1 1.08 (33%), ratio 43, ++BD response, +airtrapping, DLCO 78%  - Spirometry 01/12/2017  FEV1 1.92 (57%)  Ratio 60 p am symbicort  160 - 10/04/2018  After extensive coaching inhaler device,  effectiveness =    90%   Adequate control on present rx, reviewed in detail with pt > no change in rx needed    Low-dose CT lung cancer screening is recommended for patients who are 62-62 years of age with a 30+ pack-year history of smoking, and who are currently smoking or quit <=15 years ago.   He is now 12 years out but still in the window of eligibility if he or PCP desire > would refer to our Advanced decision making clinic Kandice Robinsons, NP ) if desired.  O/w f/u yearly, sooner prn    I had an extended discussion with the patient reviewing all relevant studies completed to date and  lasting 15 to 20 minutes of a 25 minute visit    See device teaching which extended face to face time for this visit.  Each maintenance medication was reviewed in detail including emphasizing most importantly the difference between maintenance and prns and under what circumstances the prns are to be triggered using an action plan format that is not reflected in the computer generated alphabetically organized AVS which I have not found useful in most complex patients, especially with respiratory illnesses  Please see AVS for specific instructions unique to this visit that I personally wrote and verbalized to the the pt in detail and then reviewed with pt  by my nurse highlighting any  changes in therapy recommended at today's visit to their plan of care.

## 2019-10-03 ENCOUNTER — Encounter: Payer: Self-pay | Admitting: Internal Medicine

## 2019-10-03 ENCOUNTER — Other Ambulatory Visit: Payer: Self-pay

## 2019-10-03 ENCOUNTER — Ambulatory Visit: Payer: 59 | Admitting: Internal Medicine

## 2019-10-03 DIAGNOSIS — J449 Chronic obstructive pulmonary disease, unspecified: Secondary | ICD-10-CM

## 2019-10-03 NOTE — Patient Instructions (Signed)
No change in medications  Review your drug formulary for cheaper options for generic symbicort 160 2bid   Please schedule a follow up visit in 12 months but call sooner if needed

## 2019-10-03 NOTE — Progress Notes (Signed)
Subjective:     Patient ID: [name], male   DOB: July 23, 1956, 64 y.o.   MRN: [id number]    Brief patient profile:  63yowm quit smoking in 2008  At wt 160 with copd/AB (GOLD III criteria with marked reversibility)  on ACEi and advair with baseline MMRC2 = can't walk a nl pace on a flat grade s sob but does fine slow and flat  / prev folllowed by Clance/ Sood but only GOLD II as of 01/12/2017 off acei/ advair and on symb 160 2bid     History of Present Illness  12/25/2016 1st [facility] office visit/ [name]   Chief Complaint  Patient presents with  . Acute Visit    Has been feeling tired for the past week. Fever, non-productive cough. Took some Dayquil around 6am this morning.    acute onset with sore throat/ hoarseness but chronically bothered by dry raspy daytime cough and limiting down on advair 250 and using lots of saba baseline and with exac s benefit rec zpak  Stop lisinopril and start avapro 150 mg daily instead Stop advair and start symbicort 160 Take 2 puffs first thing in am and then another 2 puffs about 12 hours later.  Continue  nexium Take 30-60 min before first meal of the day and add pepcid 20mg  at bedtime until better GERD diet   01/12/2017  f/u ov/[name] re: GOLD II copd/ ab component with symptoms resolved off acei and advair/ on symb 160 2bid Chief Complaint  Patient presents with  . Follow-up    Breathing is doing well and he is no longer coughing. He has not had to use his albuterol inhaler.   never took the pepcid but much better   At this point  Only taking avapro 150 one half daily  Doe = MMRC1 = can walk nl pace, flat grade, can't hurry or go uphills or steps s sob   rec Continue symbicort 160 Take 2 puffs first thing in am and then another 2 puffs about 12 hours later.     04/13/2017  f/u ov/[name] re:  GOLD II / copd with ab component  Chief Complaint  Patient presents with  . Follow-up    Doing well and denies any new co's. He has not had to use  albuterol at all.    mmrc still = 1  No need for saba  rec No change in medications  Keep up the walking / work on weight loss To get the most out of exercise, you need to be continuously aware that you are short of breath, but never out of breath, for 30 minutes daily. As you improve, it will actually be easier for you to do the same amount of exercise  in  30 minutes so always push to the level where you are short of breath       10/04/2018  f/u ov/[name] re: copd/ ab  Chief Complaint  Patient presents with  . Follow-up    Breathing is overall doing well. He has not used his albuterol inhaler and it has expired.   Dyspnea:  Not limited by breathing from desired activities  / avoids hills/ does ok with steps  Cough: none  Sleeping: flat / no noct symptoms SABA use: none  02: none  rec Plan A = Automatic = symbicot 160 Take 2 puffs first thing in am and then another 2 puffs about 12 hours later.  Work on inhaler technique:  Plan B = Backup Only use your albuterol inhaler as a rescue medication     10/03/2019  f/u ov/[name] re: COPD GOLD II/ AB Chief Complaint  Patient presents with  . Follow-up    Breathing is doing well and he rarely uses the albuterol. No new co's.  Dyspnea:  [place], slows down on hills, no flares  Cough: none Sleeping: lie flat/ side / no noct c/o's SABA use: none  02: none    No obvious day to day or daytime variability or assoc excess/ purulent sputum or mucus plugs or hemoptysis or cp or chest tightness, subjective wheeze or overt sinus or hb symptoms.   Sleeping as aov without nocturnal  or early am exacerbation  of respiratory  c/o's or need for noct saba. Also denies any obvious fluctuation of symptoms with weather or environmental changes or other aggravating or alleviating factors except as outlined above   No unusual exposure hx or h/o childhood pna/ asthma or knowledge of premature birth.  Current Allergies, Complete Past Medical  History, Past Surgical History, Family History, and Social History were reviewed in [employer] record.  ROS  The following are not active complaints unless bolded Hoarseness, sore throat, dysphagia, dental problems, itching, sneezing,  nasal congestion or discharge of excess mucus or purulent secretions, ear ache,   fever, chills, sweats, unintended wt loss or wt gain, classically pleuritic or exertional cp,  orthopnea pnd or arm/hand swelling  or leg swelling, presyncope, palpitations, abdominal pain, anorexia, nausea, vomiting, diarrhea  or change in bowel habits or change in bladder habits, change in stools or change in urine, dysuria, hematuria,  rash, arthralgias, visual complaints, headache, numbness, weakness or ataxia or problems with walking or coordination,  change in mood or  memory.        Current Meds  Medication Sig  . albuterol (PROAIR HFA) 108 (90 Base) MCG/ACT inhaler 2 puffs every 4 hours as needed only  if your can't catch your breath  . aspirin 81 MG tablet Take 81 mg by mouth daily.    . budesonide-formoterol (SYMBICORT) 160-4.5 MCG/ACT inhaler Take 2 puffs first thing in am and then another 2 puffs about 12 hours later.  . Cholecalciferol (VITAMIN D-3) 5000 units TABS Take 1 tablet by mouth daily.  . fish oil-omega-3 fatty acids 1000 MG capsule Take 1 g by mouth daily.    . irbesartan (AVAPRO) 150 MG tablet Take 1 tablet (150 mg total) by mouth daily.  . Multiple Vitamin (MULTIVITAMIN WITH MINERALS) TABS tablet Take 1 tablet by mouth daily.  . pantoprazole (PROTONIX) 40 MG tablet Take 1 tablet by mouth daily.          Objective:   Physical Exam  amb pleasant obese wm nad   10/03/2019       224  10/04/2018       228  10/05/2017       225  04/13/2017       225   01/12/2017      220   12/25/16 219 lb (99.3 kg)  12/18/15 221 lb 9.6 oz (100.5 kg)  12/05/14 219 lb 3.2 oz (99.4 kg)    Vital signs reviewed  10/03/2019  - Note at rest 02 sats  95% on  RA      HEENT : pt wearing mask not removed for exam due to covid -19 concerns.    NECK :  without JVD/Nodes/TM/ nl carotid upstrokes bilaterally   LUNGS: no  acc muscle use,  Nl contour chest which is clear to A and P bilaterally without cough on insp or exp maneuvers   CV:  RRR  no s3 or murmur or increase in P2, and no edema   ABD:  soft and nontender with nl inspiratory excursion in the supine position. No bruits or organomegaly appreciated, bowel sounds nl  MS:  Nl gait/ ext warm without deformities, calf tenderness, cyanosis or clubbing No obvious joint restrictions   SKIN: warm and dry without lesions    NEURO:  alert, approp, nl sensorium with  no motor or cerebellar deficits apparent.            Assessment:

## 2019-10-04 ENCOUNTER — Encounter: Payer: Self-pay | Admitting: Internal Medicine

## 2019-10-04 NOTE — Assessment & Plan Note (Signed)
Quit smoking 2008 PFT's 2008:  FEV1 1.08 (33%), ratio 43, ++BD response, +airtrapping, DLCO 78%  - Spirometry 01/12/2017  FEV1 1.92 (57%)  Ratio 60 p am symbicort  160 - 10/04/2018  After extensive coaching inhaler device,  effectiveness =    90%    All goals of chronic asthma control met including optimal function and elimination of symptoms with minimal need for rescue therapy.  Contingencies discussed in full including contacting this office immediately if not controlling the symptoms using the rule of two's.     F/u can be yearly - could try breztri if the doe component worsens at all but at this point he's not interested in addition of lama component.         Each maintenance medication was reviewed in detail including emphasizing most importantly the difference between maintenance and prns and under what circumstances the prns are to be triggered using an action plan format where appropriate.  Total time for H and P, chart review, counseling, reviewing device/options and generating customized AVS unique to this office visit / charting = 20 min

## 2019-10-09 ENCOUNTER — Other Ambulatory Visit: Payer: Self-pay | Admitting: Internal Medicine

## 2019-10-09 DIAGNOSIS — J441 Chronic obstructive pulmonary disease with (acute) exacerbation: Secondary | ICD-10-CM

## 2019-11-12 ENCOUNTER — Other Ambulatory Visit: Payer: Self-pay | Admitting: Internal Medicine

## 2019-11-12 DIAGNOSIS — J441 Chronic obstructive pulmonary disease with (acute) exacerbation: Secondary | ICD-10-CM

## 2019-12-11 ENCOUNTER — Other Ambulatory Visit: Payer: Self-pay | Admitting: Internal Medicine

## 2019-12-11 DIAGNOSIS — J441 Chronic obstructive pulmonary disease with (acute) exacerbation: Secondary | ICD-10-CM

## 2020-07-24 ENCOUNTER — Ambulatory Visit: Payer: 59

## 2020-10-02 ENCOUNTER — Other Ambulatory Visit: Payer: Self-pay

## 2020-10-02 ENCOUNTER — Ambulatory Visit: Payer: 59 | Admitting: Internal Medicine

## 2020-10-02 ENCOUNTER — Encounter: Payer: Self-pay | Admitting: Internal Medicine

## 2020-10-02 DIAGNOSIS — J449 Chronic obstructive pulmonary disease, unspecified: Secondary | ICD-10-CM | POA: Diagnosis not present

## 2020-10-02 DIAGNOSIS — Z87891 Personal history of nicotine dependence: Secondary | ICD-10-CM | POA: Diagnosis not present

## 2020-10-02 MED ORDER — BREZTRI AEROSPHERE 160-9-4.8 MCG/ACT IN AERO
2.0000 | INHALATION_SPRAY | Freq: Two times a day (BID) | RESPIRATORY_TRACT | 11 refills | Status: DC
Start: 2020-10-02 — End: 2021-10-08

## 2020-10-02 MED ORDER — BREZTRI AEROSPHERE 160-9-4.8 MCG/ACT IN AERO: 2.0000 | INHALATION_SPRAY | Freq: Two times a day (BID) | RESPIRATORY_TRACT | 0 refills | Status: DC

## 2020-10-02 NOTE — Patient Instructions (Addendum)
Call back if your desire lung cancer screening > scheduled with Kandice Robinsons NP   Ok to try breztri Take 2 puffs first thing in am and then another 2 puffs about 12 hours later.    Please schedule a follow up visit in 12 months but call sooner if needed

## 2020-10-02 NOTE — Progress Notes (Signed)
Subjective:     Patient ID: [name], male   DOB: [birth year]    MRN: [id number]    Brief patient profile:  76  yowm quit smoking in 2008  At wt 160 with copd/AB (GOLD III criteria with marked reversibility)  on ACEi and advair with baseline MMRC2 = can't walk a nl pace on a flat grade s sob but does fine slow and flat  / prev folllowed by Clance/ Sood but only GOLD II as of 01/12/2017 off acei/ advair and on symb 160 2bid     History of Present Illness  12/25/2016 1st [facility] office visit/ Dava Rensch   Chief Complaint  Patient presents with  . Acute Visit    Has been feeling tired for the past week. Fever, non-productive cough. Took some Dayquil around 6am this morning.    acute onset with sore throat/ hoarseness but chronically bothered by dry raspy daytime cough and limiting down on advair 250 and using lots of saba baseline and with exac s benefit rec zpak  Stop lisinopril and start avapro 150 mg daily instead Stop advair and start symbicort 160 Take 2 puffs first thing in am and then another 2 puffs about 12 hours later.  Continue  nexium Take 30-60 min before first meal of the day and add pepcid 20mg  at bedtime until better GERD diet   01/12/2017  f/u ov/Pax Reasoner re: GOLD II copd/ ab component with symptoms resolved off acei and advair/ on symb 160 2bid Chief Complaint  Patient presents with  . Follow-up    Breathing is doing well and he is no longer coughing. He has not had to use his albuterol inhaler.   never took the pepcid but much better   At this point  Only taking avapro 150 one half daily  Doe = MMRC1 = can walk nl pace, flat grade, can't hurry or go uphills or steps s sob   rec Continue symbicort 160 Take 2 puffs first thing in am and then another 2 puffs about 12 hours later.     04/13/2017  f/u ov/Jency Schnieders re:  GOLD II / copd with ab component  Chief Complaint  Patient presents with  . Follow-up    Doing well and denies any new co's. He has not had to use  albuterol at all.    mmrc still = 1  No need for saba  rec No change in medications  Keep up the walking / work on weight loss To get the most out of exercise, you need to be continuously aware that you are short of breath, but never out of breath, for 30 minutes daily. As you improve, it will actually be easier for you to do the same amount of exercise  in  30 minutes so always push to the level where you are short of breath       10/04/2018  f/u ov/Ossie Beltran re: copd/ ab  Chief Complaint  Patient presents with  . Follow-up    Breathing is overall doing well. He has not used his albuterol inhaler and it has expired.   Dyspnea:  Not limited by breathing from desired activities  / avoids hills/ does ok with steps  Cough: none  Sleeping: flat / no noct symptoms SABA use: none  02: none  rec Plan A = Automatic = symbicot 160 Take 2 puffs first thing in am and then another 2 puffs about 12 hours later.  Work on inhaler technique:  Plan B = Backup Only use your albuterol inhaler as a rescue medication     10/03/2019  f/u ov/Korin Hartwell re: COPD GOLD II/ AB Chief Complaint  Patient presents with  . Follow-up    Breathing is doing well and he rarely uses the albuterol. No new co's.  Dyspnea:  [place], slows down on hills, no flares  Cough: none Sleeping: lie flat/ side / no noct c/o's SABA use: none  02: none  rec No change in medications Review your drug formulary for cheaper options for generic symbicort 160 2bid   10/02/2020  f/u ov/Mahari Strahm re: COPD II/ AB Chief Complaint  Patient presents with  . Follow-up    Breathing is doing well and no new co's today. He has not used his rescue inhaler since last visit.    Dyspnea:  MMRC1 = can walk nl pace, flat grade, can't hurry or go uphills or steps s sob   Cough: none  Sleeping: flat bed/ one head pillow SABA use: none  02: none Covid status: vax x 3    No obvious day to day or daytime variability or assoc excess/ purulent  sputum or mucus plugs or hemoptysis or cp or chest tightness, subjective wheeze or overt sinus or hb symptoms.   Sleeping  without nocturnal  or early am exacerbation  of respiratory  c/o's or need for noct saba. Also denies any obvious fluctuation of symptoms with weather or environmental changes or other aggravating or alleviating factors except as outlined above   No unusual exposure hx or h/o childhood pna/ asthma or knowledge of premature birth.  Current Allergies, Complete Past Medical History, Past Surgical History, Family History, and Social History were reviewed in [employer] record.  ROS  The following are not active complaints unless bolded Hoarseness, sore throat, dysphagia, dental problems, itching, sneezing,  nasal congestion or discharge of excess mucus or purulent secretions, ear ache,   fever, chills, sweats, unintended wt loss or wt gain, classically pleuritic or exertional cp,  orthopnea pnd or arm/hand swelling  or leg swelling, presyncope, palpitations, abdominal pain, anorexia, nausea, vomiting, diarrhea  or change in bowel habits or change in bladder habits, change in stools or change in urine, dysuria, hematuria,  rash, arthralgias, visual complaints, headache, numbness, weakness or ataxia or problems with walking or coordination,  change in mood or  memory.        Current Meds  Medication Sig  . albuterol (PROAIR HFA) 108 (90 Base) MCG/ACT inhaler 2 puffs every 4 hours as needed only  if your can't catch your breath  . aspirin 81 MG tablet Take 81 mg by mouth daily.  . budesonide-formoterol (SYMBICORT) 160-4.5 MCG/ACT inhaler TAKE 2 PUFFS FIRST THING IN THE MORNING AND THEN ANOTHER 2 PUFFS ABOUT 12 HOURS LATER  . Cholecalciferol (VITAMIN D-3) 5000 units TABS Take 1 tablet by mouth daily.  . fish oil-omega-3 fatty acids 1000 MG capsule Take 1 g by mouth daily.  . irbesartan (AVAPRO) 150 MG tablet Take 1 tablet (150 mg total) by mouth daily.  .  Multiple Vitamin (MULTIVITAMIN WITH MINERALS) TABS tablet Take 1 tablet by mouth daily.  . pantoprazole (PROTONIX) 40 MG tablet Take 1 tablet by mouth daily.                      Objective:   Physical Exam     10/02/2020       229 10/03/2019  224  10/04/2018       228  10/05/2017       225  04/13/2017       225   01/12/2017      220   12/25/16 219 lb (99.3 kg)  12/18/15 221 lb 9.6 oz (100.5 kg)  12/05/14 219 lb 3.2 oz (99.4 kg)      Vital signs reviewed  10/02/2020  - Note at rest 02 sats  94% on RA   General appearance:    amb wm nad  HEENT : pt wearing mask not removed for exam due to covid - 19 concerns.   NECK :  without JVD/Nodes/TM/ nl carotid upstrokes bilaterally   LUNGS: no acc muscle use,  Min barrel  contour chest wall with bilateral mid/late exp rhonchi  and  without cough on insp or exp maneuvers and min  Hyperresonant  to  percussion bilaterally     CV:  RRR  no s3 or murmur or increase in P2, and no edema   ABD:  soft and nontender with pos end  insp Hoover's  in the supine position. No bruits or organomegaly appreciated, bowel sounds nl  MS:   Nl gait/  ext warm without deformities, calf tenderness, cyanosis or clubbing No obvious joint restrictions   SKIN: warm and dry without lesions    NEURO:  alert, approp, nl sensorium with  no motor or cerebellar deficits apparent.             Assessment:

## 2020-10-02 NOTE — Assessment & Plan Note (Signed)
Quit smoking 2008  Low-dose CT lung cancer screening is recommended for patients who are 59-64 years of age with a 30+ pack-year history of smoking, and who are currently smoking or quit <=15 years ago.    he is eligible for screening x at least one more year > will discuss with wife and refer for shared decision making if decides to proceed.   F/u yearly or refills per PCP, whichever he prefers          Each maintenance medication was reviewed in detail including emphasizing most importantly the difference between maintenance and prns and under what circumstances the prns are to be triggered using an action plan format where appropriate.  Total time for H and P, chart review, counseling, reviewing hfa  device(s) and generating customized AVS unique to this office visit / same day charting > 30 min

## 2020-10-02 NOTE — Assessment & Plan Note (Signed)
Quit smoking 2008 PFT's 2008:  FEV1 1.08 (33%), ratio 43, ++BD response, +airtrapping, DLCO 78%  - Spirometry 01/12/2017  FEV1 1.92 (57%)  Ratio 60 p am symbicort  160 - 10/04/2018  After extensive coaching inhaler device,  effectiveness =    90%   - 10/02/2020  Trial of breztri 2bid    Still having doe x hills and rhonchi on exam so reasonable to try breztri 2bid

## 2021-06-20 ENCOUNTER — Telehealth: Payer: Self-pay | Admitting: Internal Medicine

## 2021-06-20 MED ORDER — AZITHROMYCIN 250 MG PO TABS: ORAL_TABLET | ORAL | 0 refills | Status: AC

## 2021-06-20 NOTE — Telephone Encounter (Signed)
I have called and spoke with pt and he stated that he left work early yesterday since he was not feeling good.    He was aching and had a headache and a dry cough.  Now he has a sore throat, cough that is non productive, chest is hurting from coughing, he is fatigued.  He tested negative for covid.  His temp is 100.  He denies any sinus congestion.  He does have a nebulizer at home to use if needed.  He did schedule appt for Monday but wanted to reach out to see if he needs to do anything else.  MW please advise. Thanks  No Known Allergies

## 2021-06-20 NOTE — Telephone Encounter (Signed)
Pt states he is achey and coughing, sever lack of energy. This began yesterday. Negative for covid. Wanted appt today but we are booked. Scheduled OV for Monday morning but would like something called in if possible. [facility] [address] [place]

## 2021-06-20 NOTE — Telephone Encounter (Signed)
Mucinex dm 1200 mg every 12 hours as needed for cough / congestions and tylenol for pain and we'll  can call in a zpak but in all likelihood this is a viral infection.  To ER  over the weekend if can't get breathing comfortable at rest after albuterol rx up to 2 puffs  every 4 hours prn

## 2021-06-20 NOTE — Telephone Encounter (Signed)
Patient is aware of recommendations and voiced his understanding.  Zpak has been sent to preferred pharmacy. Nothing further needed at this time.

## 2021-06-23 ENCOUNTER — Encounter: Payer: Self-pay | Admitting: Primary Care

## 2021-06-23 ENCOUNTER — Telehealth: Payer: Self-pay | Admitting: Primary Care

## 2021-06-23 ENCOUNTER — Ambulatory Visit: Payer: BC Managed Care – PPO | Admitting: Primary Care

## 2021-06-23 ENCOUNTER — Other Ambulatory Visit: Payer: Self-pay

## 2021-06-23 ENCOUNTER — Ambulatory Visit (INDEPENDENT_AMBULATORY_CARE_PROVIDER_SITE_OTHER): Payer: BC Managed Care – PPO

## 2021-06-23 VITALS — BP 130/82 | HR 85 | Ht 68.0 in | Wt 223.4 lb

## 2021-06-23 DIAGNOSIS — J101 Influenza due to other identified influenza virus with other respiratory manifestations: Secondary | ICD-10-CM | POA: Diagnosis not present

## 2021-06-23 DIAGNOSIS — J441 Chronic obstructive pulmonary disease with (acute) exacerbation: Secondary | ICD-10-CM

## 2021-06-23 DIAGNOSIS — R0989 Other specified symptoms and signs involving the circulatory and respiratory systems: Secondary | ICD-10-CM | POA: Diagnosis not present

## 2021-06-23 DIAGNOSIS — J209 Acute bronchitis, unspecified: Secondary | ICD-10-CM | POA: Diagnosis not present

## 2021-06-23 LAB — POCT INFLUENZA A/B
Influenza A, POC: POSITIVE — AB
Influenza B, POC: NEGATIVE

## 2021-06-23 MED ORDER — HYDROCODONE BIT-HOMATROP MBR 5-1.5 MG/5ML PO SOLN: 5.0000 mL | Freq: Four times a day (QID) | ORAL | 0 refills | Status: DC | PRN

## 2021-06-23 MED ORDER — PREDNISONE 10 MG PO TABS: ORAL_TABLET | ORAL | 0 refills | Status: DC

## 2021-06-23 MED ORDER — OSELTAMIVIR PHOSPHATE 75 MG PO CAPS: 75.0000 mg | ORAL_CAPSULE | Freq: Two times a day (BID) | ORAL | 0 refills | Status: DC

## 2021-06-23 NOTE — Assessment & Plan Note (Signed)
-   Patient tested positive for Influenza A today in office. He has not received annual vaccine yet. Symptoms started 3-4 days ago. Sending in Tamiflu prescription. Advised rest, fluids and tylenol prn fever/chills/aches.

## 2021-06-23 NOTE — Progress Notes (Signed)
I attempted to call patient, LM. Please reach out to him again this afternoon to review CXR results which showed evidence of bronchitis right lung, possible developing pneumonia. No change to plan. If not getting better in 2-3 days call me

## 2021-06-23 NOTE — Assessment & Plan Note (Addendum)
-   Patient developed cough on 11/3 with associated fatigue, headache, sore throat and pleuritic chest pain. He is currently on Zpack. We will send in prednisone taper 40mg  x 2 days; 20mg  x 2 days; 10mg  x 2 days; Hycodan cough syrup 58ml q 6 hours prn cough. Continue Breztri Aerosphere two puffs twice daily, prn Proair 2 puffs q4-6 hours and mucinex 1200mg  twice daily. Checking CXR and influenza swab. FU if not better.

## 2021-06-23 NOTE — Patient Instructions (Addendum)
  Influenza swab was positive for FLU A   Recommendations: - Finish Zpack - Sending in Tamiflu  - Start prednisone taper as directed  - Take Mucinex 1,200mg  twice daily (stop day/nightquil) - Take Hycodan cough 4ml q 6 hours as needed for cough (do not combine with alcohol or drive when taking)  Orders: - CXR today (done, results pending)  Follow-up: - If not better please call/return

## 2021-06-23 NOTE — Progress Notes (Signed)
@Patient  ID: [name], male    DOB: 1956/07/01, 65 y.o.   MRN: [id number]  Chief Complaint  Patient presents with   Follow-up    Coughing since 11/3 pain in chest    Referring provider: Orpah Melter, MD  HPI: 65 year old male, former smoker quit 2008 (30-pack-year history).  Past medical history significant for COPD Gold 2 hypertension, GERD, obesity.  Patient of Dr. Melvyn Novas, last seen on 10/02/2020.  06/23/2021- Interim hx  Patient presents today for acute visit/ COPD exacerbation. Accompanied by his wife. Patient called our office on 06/20/21 with complaints of headache, dry cough, sore throat. He is having some pleuritic discomfort from coughing. He had temp 100.9 3-4 days ago. He was sent in [place] and advised to start mucinex 1200mg  Q12 hours as needed for cough. He is compliant with Judithann Sauger Aerosphere two twice daily. He uses proair rescue inhaler for the first time in years x3 with some improvement. Covid was negative x 2.   No Known Allergies  Immunization History  Administered Date(s) Administered   Influenza Inj Mdck Quad Pf 05/18/2019   Influenza Split 05/18/2011, 05/23/2012, 05/16/2014, 06/04/2018   Influenza Whole 05/17/2009, 05/17/2017   Influenza,inj,Quad PF,6+ Mos 08/07/2013, 05/30/2014, 04/18/2015, 05/06/2016, 05/12/2017, 06/04/2018, 05/24/2019, 06/19/2020   Influenza,inj,quad, With Preservative 05/03/2015   Moderna Sars-Covid-2 Vaccination 09/18/2019, 10/16/2019, 06/25/2020   Pneumococcal Conjugate-13 12/18/2015   Pneumococcal Polysaccharide-23 05/17/2008, 05/03/2015   Td 05/24/2019   Tdap 07/15/2009   Zoster Recombinat (Shingrix) 06/24/2018, 10/03/2018   Zoster, Live 06/24/2018, 10/03/2018    Past Medical History:  Diagnosis Date   COPD (chronic obstructive pulmonary disease) with emphysema (HCC)    Esophageal reflux    Exertional shortness of breath    "just today" (09/27/2013)   Hypertension     Tobacco History: Social History   Tobacco Use   Smoking Status Former   Packs/day: 1.00   Years: 30.00   Pack years: 30.00   Types: Cigarettes   Quit date: 05/18/2007   Years since quitting: 14.1  Smokeless Tobacco Never   Counseling given: Not Answered   Outpatient Medications Prior to Visit  Medication Sig Dispense Refill   albuterol (PROAIR HFA) 108 (90 Base) MCG/ACT inhaler 2 puffs every 4 hours as needed only  if your can't catch your breath 1 Inhaler 1   aspirin 81 MG tablet Take 81 mg by mouth daily.     azithromycin (ZITHROMAX) 250 MG tablet Take 2 tablets (500 mg) on  Day 1,  followed by 1 tablet (250 mg) once daily on Days 2 through 5. 6 each 0   Budeson-Glycopyrrol-Formoterol (BREZTRI AEROSPHERE) 160-9-4.8 MCG/ACT AERO Inhale 2 puffs into the lungs 2 (two) times daily. 10.7 g 11   Cholecalciferol (VITAMIN D-3) 5000 units TABS Take 1 tablet by mouth daily.     dextromethorphan-guaiFENesin (MUCINEX DM) 30-600 MG 12hr tablet Take 1 tablet by mouth 2 (two) times daily.     fish oil-omega-3 fatty acids 1000 MG capsule Take 1 g by mouth daily.     irbesartan (AVAPRO) 150 MG tablet Take 1 tablet (150 mg total) by mouth daily. 30 tablet 11   Multiple Vitamin (MULTIVITAMIN WITH MINERALS) TABS tablet Take 1 tablet by mouth daily.     pantoprazole (PROTONIX) 40 MG tablet Take 1 tablet by mouth daily.  5   No facility-administered medications prior to visit.   Review of Systems  Review of Systems  Constitutional:  Positive for fatigue and fever.  HENT:  Positive for congestion.  Respiratory:  Positive for cough.     Physical Exam  BP 130/82 (BP Location: Left Arm, Cuff Size: Normal)   Pulse 85   Ht 5\' 8"  (1.727 m)   Wt 223 lb 6.4 oz (101.3 kg)   SpO2 95%   BMI 33.97 kg/m  Physical Exam Constitutional:      General: He is not in acute distress.    Appearance: Normal appearance.  HENT:     Head: Normocephalic and atraumatic.     Mouth/Throat:     Mouth: Mucous membranes are moist.     Pharynx: Oropharynx is  clear.  Cardiovascular:     Rate and Rhythm: Normal rate and regular rhythm.  Pulmonary:     Effort: Pulmonary effort is normal.     Breath sounds: Rhonchi present. No wheezing or rales.     Comments: Faint rhonchi right base  Musculoskeletal:        General: Normal range of motion.  Skin:    General: Skin is warm.  Neurological:     General: No focal deficit present.     Mental Status: He is alert and oriented to person, place, and time. Mental status is at baseline.  Psychiatric:        Mood and Affect: Mood normal.        Behavior: Behavior normal.        Thought Content: Thought content normal.        Judgment: Judgment normal.     Lab Results:  CBC    Component Value Date/Time   WBC 9.3 12/01/2017 1303   RBC 5.10 12/01/2017 1303   HGB 15.7 12/01/2017 1303   HCT 46.0 12/01/2017 1303   PLT 229 12/01/2017 1303   MCV 90.2 12/01/2017 1303   MCH 30.8 12/01/2017 1303   MCHC 34.1 12/01/2017 1303   RDW 13.1 12/01/2017 1303   LYMPHSABS 1.2 09/27/2013 1230   MONOABS 0.8 09/27/2013 1230   EOSABS 0.1 09/27/2013 1230   BASOSABS 0.0 09/27/2013 1230    BMET    Component Value Date/Time   NA 137 12/01/2017 1303   K 4.0 12/01/2017 1303   CL 106 12/01/2017 1303   CO2 21 (L) 12/01/2017 1303   GLUCOSE 145 (H) 12/01/2017 1303   BUN 16 12/01/2017 1303   CREATININE 0.94 12/01/2017 1303   CALCIUM 9.2 12/01/2017 1303   GFRNONAA >60 12/01/2017 1303   GFRAA >60 12/01/2017 1303    BNP No results found for: BNP  ProBNP No results found for: PROBNP  Imaging: DG Chest 2 View  Result Date: 06/23/2021 CLINICAL DATA:  Acute bronchitis, rhonchi right base EXAM: CHEST - 2 VIEW COMPARISON:  Radiograph 12/01/2017 FINDINGS: Unchanged cardiomediastinal silhouette. There is mild bilateral perihilar peribronchial thickening and slight increased opacities in the right middle lobe and lingula. No large pleural effusion or visible pneumothorax. There is no acute osseous abnormality. Thoracic  spondylosis. IMPRESSION: Findings are suggestive of bronchitis, with slight increased opacities in the right middle lobe and lingula which could represent developing superimposed pneumonia. Electronically Signed   By: 12/03/2017 M.D.   On: 06/23/2021 11:59     Assessment & Plan:   COPD exacerbation (HCC) - Patient developed cough on 11/3 with associated fatigue, headache, sore throat and pleuritic chest pain. He is currently on Zpack. We will send in prednisone taper 40mg  x 2 days; 20mg  x 2 days; 10mg  x 2 days; Hycodan cough syrup 45ml q 6 hours prn cough. Continue Breztri Aerosphere two puffs twice  daily, prn Proair 2 puffs q4-6 hours and mucinex 1200mg  twice daily. Checking CXR and influenza swab. FU if not better.   Influenza A - Patient tested positive for Influenza A today in office. He has not received annual vaccine yet. Symptoms started 3-4 days ago. Sending in Tamiflu prescription. Advised rest, fluids and tylenol prn fever/chills/aches.   40 mins spent on case; >50% face to face with patient   Martyn Ehrich, NP 06/23/2021

## 2021-06-23 NOTE — Telephone Encounter (Signed)
Glenford Bayley, NP  06/23/2021 12:26 PM EST     I attempted to call patient, LM. Please reach out to him again this afternoon to review CXR results which showed evidence of bronchitis right lung, possible developing pneumonia. No change to plan. If not getting better in 2-3 days call me     Spoke with the pt and notified of results per [facility]. Pt verbalized understanding. Will call If not improving.

## 2021-06-24 ENCOUNTER — Encounter: Payer: Self-pay | Admitting: *Deleted

## 2021-07-02 DIAGNOSIS — Z23 Encounter for immunization: Secondary | ICD-10-CM | POA: Diagnosis not present

## 2021-07-02 DIAGNOSIS — I10 Essential (primary) hypertension: Secondary | ICD-10-CM | POA: Diagnosis not present

## 2021-07-02 DIAGNOSIS — Z0000 Encounter for general adult medical examination without abnormal findings: Secondary | ICD-10-CM | POA: Diagnosis not present

## 2021-07-02 DIAGNOSIS — Z125 Encounter for screening for malignant neoplasm of prostate: Secondary | ICD-10-CM | POA: Diagnosis not present

## 2021-10-02 ENCOUNTER — Ambulatory Visit: Payer: 59 | Admitting: Internal Medicine

## 2021-10-08 ENCOUNTER — Other Ambulatory Visit: Payer: Self-pay | Admitting: Internal Medicine

## 2021-10-15 ENCOUNTER — Encounter: Payer: Self-pay | Admitting: Internal Medicine

## 2021-10-15 ENCOUNTER — Ambulatory Visit: Payer: BC Managed Care – PPO | Admitting: Internal Medicine

## 2021-10-15 ENCOUNTER — Other Ambulatory Visit: Payer: Self-pay

## 2021-10-15 DIAGNOSIS — J449 Chronic obstructive pulmonary disease, unspecified: Secondary | ICD-10-CM

## 2021-10-15 MED ORDER — ALBUTEROL SULFATE HFA 108 (90 BASE) MCG/ACT IN AERS: INHALATION_SPRAY | RESPIRATORY_TRACT | 1 refills | Status: AC

## 2021-10-15 NOTE — Progress Notes (Signed)
Subjective:  ?  ? Patient ID: [name], male   DOB: June 06, 1956    MRN: [id number] ? ?  ?Brief patient profile:  ?42 yowm quit smoking in 2008  At wt 160 with copd/AB (GOLD III criteria with marked reversibility)  on ACEi and advair with baseline MMRC2 = can't walk a nl pace on a flat grade s sob but does fine slow and flat  / prev folllowed by [facility]/ [facility] but only GOLD II as of 01/12/2017 off acei/ advair and on symb 160 2bid  ? ? ? ?History of Present Illness  ?12/25/2016 1st [facility] office visit/ Mickie Kozikowski   ?Chief Complaint  ?Patient presents with  ? Acute Visit  ?  Has been feeling tired for the past week. Fever, non-productive cough. Took some Dayquil around 6am this morning.   ? acute onset with sore throat/ hoarseness but chronically bothered by dry raspy daytime cough and limiting down on advair 250 and using lots of saba baseline and with exac s benefit ?rec ?zpak  ?Stop lisinopril and start avapro 150 mg daily instead ?Stop advair and start symbicort 160 Take 2 puffs first thing in am and then another 2 puffs about 12 hours later.  ?Continue  nexium Take 30-60 min before first meal of the day and add pepcid 20mg  at bedtime until better ?GERD diet ? ? ?10/02/2020  f/u ov/Mahlik Lenn re: COPD II/ AB ?Chief Complaint  ?Patient presents with  ? Follow-up  ?  Breathing is doing well and no new co's today. He has not used his rescue inhaler since last visit.   ? Dyspnea:  MMRC1 = can walk nl pace, flat grade, can't hurry or go uphills or steps s sob   ?Cough: none  ?Sleeping: flat bed/ one head pillow ?SABA use: none  ?02: none ?Covid status: vax x 3   ?Rec ?Call back if your desire lung cancer screening > scheduled with Eric Form NP  ?Ok to try breztri Take 2 puffs first thing in am and then another 2 puffs about 12 hours later.  ? ? ?10/15/2021  f/u ov/Kalenna Millett re: GOLD 2 AB   maint on breztri   ?Chief Complaint  ?Patient presents with  ? Follow-up  ?  Follow up. Patient has no complaints.   ? Dyspnea:  walking  all day ?Cough: no ?Sleeping: flat bed one pillow ?SABA use: none  ?02: none  ?  ? ? ?No obvious day to day or daytime variability or assoc excess/ purulent sputum or mucus plugs or hemoptysis or cp or chest tightness, subjective wheeze or overt sinus or hb symptoms.  ? ?Sleeping  without nocturnal  or early am exacerbation  of respiratory  c/o's or need for noct saba. Also denies any obvious fluctuation of symptoms with weather or environmental changes or other aggravating or alleviating factors except as outlined above  ? ?No unusual exposure hx or h/o childhood pna/ asthma or knowledge of premature birth. ? ?Current Allergies, Complete Past Medical History, Past Surgical History, Family History, and Social History were reviewed in [employer] record. ? ?ROS  The following are not active complaints unless bolded ?Hoarseness, sore throat, dysphagia, dental problems, itching, sneezing,  nasal congestion or discharge of excess mucus or purulent secretions, ear ache,   fever, chills, sweats, unintended wt loss or wt gain, classically pleuritic or exertional cp,  orthopnea pnd or arm/hand swelling  or leg swelling, presyncope, palpitations, abdominal pain, anorexia, nausea, vomiting, diarrhea  or  change in bowel habits or change in bladder habits, change in stools or change in urine, dysuria, hematuria,  rash, arthralgias, visual complaints, headache, numbness, weakness or ataxia or problems with walking or coordination,  change in mood or  memory. ?      ? ?Current Meds  ?Medication Sig  ? albuterol (PROAIR HFA) 108 (90 Base) MCG/ACT inhaler 2 puffs every 4 hours as needed only  if your can't catch your breath  ? aspirin 81 MG tablet Take 81 mg by mouth daily.  ? BREZTRI AEROSPHERE 160-9-4.8 MCG/ACT AERO INHALE 2 PUFFS INTO THE LUNGS 2 TIMES DAILY  ? Cholecalciferol (VITAMIN D-3) 5000 units TABS Take 1 tablet by mouth daily.  ? fish oil-omega-3 fatty acids 1000 MG capsule Take 1 g by mouth  daily.  ? HYDROcodone bit-homatropine (HYCODAN) 5-1.5 MG/5ML syrup Take 5 mLs by mouth every 6 (six) hours as needed for cough.  ? irbesartan (AVAPRO) 150 MG tablet Take 1 tablet (150 mg total) by mouth daily.  ? Multiple Vitamin (MULTIVITAMIN WITH MINERALS) TABS tablet Take 1 tablet by mouth daily.  ? predniSONE (DELTASONE) 10 MG tablet Take 4 tabs po daily x 2 days; then 2 tabs for 2 days; then 1 tab for 2 days  ?    ? ? ? ?  ?      ?  ? ?  ?Objective:  ? Physical Exam ? ?Wt ? ? ?10/15/2021         226  ?10/02/2020       229 ?10/03/2019       224  ?10/04/2018       228  ?10/05/2017       225  ?04/13/2017       225  ? 01/12/2017      220   ?12/25/16 219 lb (99.3 kg)  ?12/18/15 221 lb 9.6 oz (100.5 kg)  ?12/05/14 219 lb 3.2 oz (99.4 kg)  ?  ? Vital signs reviewed  10/15/2021  - Note at rest 02 sats  95% on RA  ? ?General appearance:    amb pleasant wm nad ? ? ?HEENT : pt wearing mask not removed for exam due to covid - 19 concerns.  ? ?NECK :  without JVD/Nodes/TM/ nl carotid upstrokes bilaterally ? ? ?LUNGS: no acc muscle use,  Min barrel  contour chest wall with bilateral  slightly decreased bs s audible wheeze and  without cough on insp or exp maneuvers and min  Hyperresonant  to  percussion bilaterally   ? ? ?CV:  RRR  no s3 or murmur or increase in P2, and no edema  ? ?ABD:  soft and nontender with pos end  insp Hoover's  in the supine position. No bruits or organomegaly appreciated, bowel sounds nl ? ?MS:   Nl gait/  ext warm without deformities, calf tenderness, cyanosis or clubbing ?No obvious joint restrictions  ? ?SKIN: warm and dry without lesions   ? ?NEURO:  alert, approp, nl sensorium with  no motor or cerebellar deficits apparent.  ?    ? ? ?  ? ? ?Assessment:  ?  ?  ? ?

## 2021-10-15 NOTE — Assessment & Plan Note (Signed)
Quit smoking 2008 ?PFT's 2008:  FEV1 1.08 (33%), ratio 43, ++BD response, +airtrapping, DLCO 78% ? - Spirometry 01/12/2017  FEV1 1.92 (57%)  Ratio 60 p am symbicort  160 ?- 10/04/2018  After extensive coaching inhaler device,  effectiveness =    90%   ?- 10/02/2020  Trial of breztri 2bid  > marked improvement 10/15/2021  ? ? Group D in terms of symptom/risk and laba/lama/ICS  therefore appropriate rx at this point >>>  Marked improvement on breaztri likely has AB component. ? ?Declined Lung Cancer screening/ now at 20 y out from smoking so this is last year of eligility/ advised ? ?F/u q 12 m, sooner prn  ? ?    ?  ? ?Each maintenance medication was reviewed in detail including emphasizing most importantly the difference between maintenance and prns and under what circumstances the prns are to be triggered using an action plan format where appropriate. ? ?Total time for H and P, chart review, counseling, reviewing hfa  device(s) and generating customized AVS unique to this office visit / same day charting = 23 min  ?     ? ?  ?

## 2021-10-15 NOTE — Patient Instructions (Signed)
No change in medications   Please schedule a follow up visit in 12  months but call sooner if needed  

## 2021-11-24 ENCOUNTER — Telehealth: Payer: Self-pay

## 2021-11-24 ENCOUNTER — Other Ambulatory Visit (HOSPITAL_COMMUNITY): Payer: Self-pay

## 2021-11-24 NOTE — Telephone Encounter (Signed)
Patient Advocate Encounter ?  ?Received notification from [facility] that prior authorization for [facility] inhaler is required by his/her insurance [employer]. ?  ?PA submitted on 11/24/21 ? ?Key#: [id number] ? ?Status is pending ?   ?[facility] will continue to follow: ? ?Patient Advocate ?Fax: [phone]  ?

## 2021-11-25 ENCOUNTER — Other Ambulatory Visit (HOSPITAL_COMMUNITY): Payer: Self-pay

## 2021-11-25 NOTE — Telephone Encounter (Signed)
Patient Advocate Encounter ? ?Prior Authorization for [name] is not required.  May need a PA renewal in May. ? ? ?Patient Advocate ?Fax: [phone]  ?

## 2022-02-05 ENCOUNTER — Ambulatory Visit (INDEPENDENT_AMBULATORY_CARE_PROVIDER_SITE_OTHER): Payer: 59 | Admitting: Student

## 2022-02-05 ENCOUNTER — Ambulatory Visit (INDEPENDENT_AMBULATORY_CARE_PROVIDER_SITE_OTHER): Payer: 59

## 2022-02-05 ENCOUNTER — Encounter: Payer: Self-pay | Admitting: Student

## 2022-02-05 ENCOUNTER — Telehealth: Payer: Self-pay | Admitting: Internal Medicine

## 2022-02-05 VITALS — BP 160/88 | HR 80 | Temp 98.2°F | Ht 68.0 in | Wt 235.0 lb

## 2022-02-05 DIAGNOSIS — J441 Chronic obstructive pulmonary disease with (acute) exacerbation: Secondary | ICD-10-CM

## 2022-02-05 MED ORDER — AZITHROMYCIN 250 MG PO TABS: ORAL_TABLET | ORAL | 0 refills | Status: DC

## 2022-02-05 MED ORDER — PREDNISONE 10 MG PO TABS: ORAL_TABLET | ORAL | 0 refills | Status: DC

## 2022-02-05 NOTE — Telephone Encounter (Signed)
Will need ov asap gso office and if worse while waiting > ER

## 2022-02-05 NOTE — Telephone Encounter (Signed)
Called patient and he states that he is having issues with his breathing and it started at beginning of the week. Pt states his SOB is getting worse when he is up walking around. Pt states he is on Breztri daily but he states its not lasting long. Pt states he is wheezing and that his wife can hear the crackles in his lungs. Pt states that he doe shave a productive cough. No fever noted. Blood pressure has been good so far. No way of checking oxygen   Dr Sherene Sires please advise  He is thinking its PNA again.

## 2022-02-05 NOTE — Telephone Encounter (Signed)
Called patient back and told him Dr Sherene Sires wanted him to be seen asap. Got him scheduled today at 3:15 for an acute visit with Dr Francine Graven. Patient verbalized understanding. Nothing further needed

## 2022-02-05 NOTE — Patient Instructions (Addendum)
-   lab today - prednisone taper - azithromycin - if no better we'll consider ct scan - see you in October

## 2022-02-06 LAB — BASIC METABOLIC PANEL
BUN: 17 mg/dL (ref 6–23)
CO2: 24 mEq/L (ref 19–32)
Calcium: 9.5 mg/dL (ref 8.4–10.5)
Chloride: 104 mEq/L (ref 96–112)
Creatinine, Ser: 0.95 mg/dL (ref 0.40–1.50)
GFR: 84.51 mL/min (ref >60.00)
Glucose, Bld: 99 mg/dL (ref 70–99)
Potassium: 4.3 mEq/L (ref 3.5–5.1)
Sodium: 139 mEq/L (ref 135–145)

## 2022-02-06 LAB — BRAIN NATRIURETIC PEPTIDE: Pro B Natriuretic peptide (BNP): 44 pg/mL (ref 0.0–100.0)

## 2022-03-03 ENCOUNTER — Telehealth: Payer: Self-pay | Admitting: Internal Medicine

## 2022-03-03 MED ORDER — PREDNISONE 10 MG PO TABS: ORAL_TABLET | ORAL | 0 refills | Status: DC

## 2022-03-03 NOTE — Telephone Encounter (Signed)
Called and spoke with patient. He stated that he felt great while on the prednisone taper and zpak that Dr. Thora Lance sent in for him on 02/05/22 and in the 2 weeks afterwards. But he has noticed his cough and SOB have increased over the past 3-4 days. He has a productive cough and has been taking Mucinex without any help. Denied any fevers. He stated that his wife listened to his lungs and she had some some crackles.   He has an appointment with SG for this Friday but he wanted to know if Dr. Sherene Sires would be willing to send in something for him before then.   [facility]   MW, can you please advise? Thanks!

## 2022-03-03 NOTE — Telephone Encounter (Signed)
I called and spoke with the pt and notified of response per MW. He verbalized understanding. Rx was sent. Nothing further needed.

## 2022-03-03 NOTE — Telephone Encounter (Signed)
Prednisone 10 mg take  4 each am x 2 days,   2 each am x 2 days,  1 each am x 2 days and stop  

## 2022-03-06 ENCOUNTER — Encounter: Payer: Self-pay | Admitting: Acute Care

## 2022-03-06 ENCOUNTER — Ambulatory Visit (INDEPENDENT_AMBULATORY_CARE_PROVIDER_SITE_OTHER): Payer: 59 | Admitting: Acute Care

## 2022-03-06 VITALS — BP 150/90 | HR 78 | Temp 98.7°F | Ht 68.0 in | Wt 237.0 lb

## 2022-03-06 DIAGNOSIS — J441 Chronic obstructive pulmonary disease with (acute) exacerbation: Secondary | ICD-10-CM | POA: Diagnosis not present

## 2022-03-06 DIAGNOSIS — Z87891 Personal history of nicotine dependence: Secondary | ICD-10-CM | POA: Diagnosis not present

## 2022-03-06 DIAGNOSIS — J449 Chronic obstructive pulmonary disease, unspecified: Secondary | ICD-10-CM

## 2022-03-06 NOTE — Progress Notes (Signed)
History of Present Illness [name]. is a 66 y.o. male former smoker ( Quit 2008 with a  30 pack year smoking history) with COPD Gold II  last FEV1 57%, GERD, seen in  pulmonary clinic for acute exacerbation of COPD on 02/05/2022. He is followed by Dr. Thora Lance and Dr. Sherene Sires.    03/06/2022 Pt. Presents for follow up. He was seen for an acute flare of his COPD by Dr. Thora Lance  on 02/05/2022. At that time he endorsed shortness of breath since the beginning of the week along with wheezing and crackles. Some increase in his cough, coughing just a little bit up. No chest tightness. He had been compliant with his  breztri inhaler ( uses without spacer). He was treated with a prednisone taper , Azithromycin, albuterol prn, and a BNP was obtained and was normal at 44.  No ankle swelling. CXR was done that was ? For a persistent ?RML opacity. Pt. Competed treatment and felt great x 3 weeks. Then he developed shortness of breath again 03/03/2022 making the bed. Dr. Sherene Sires prescribed the patient an additional prednisone taper by phone. He states as soon as he started the prednisone he started to feel better and back to baseline. He has no complaints today at follow up.   He has not had flares in years. I question id the [place] air quality may be contributing. If he has any additional flares, he needs to have a CT chest to ensure there is not an additional possible etiology.   He is compliant with his Markus Daft daily, and understands the importance of taking all medications until gone.   Pt insurance will convert over to Medicare starting 10/1, and would like to push his 9/15   appointment  with Dr. Thora Lance to 10/1 so it is covered by Medicare. He will need PFT's prior to follow up appointment   Test Results:     Latest Ref Rng & Units 12/01/2017    1:03 PM 09/28/2013    6:45 AM 09/27/2013   12:30 PM  CBC  WBC 4.0 - 10.5 K/uL 9.3  16.4  12.0   Hemoglobin 13.0 - 17.0 g/dL 73.4  19.3  79.0   Hematocrit 39.0 -  52.0 % 46.0  44.6  46.0   Platelets 150 - 400 K/uL 229  270  229        Latest Ref Rng & Units 02/05/2022    4:03 PM 12/01/2017    1:03 PM 09/28/2013    6:45 AM  BMP  Glucose 70 - 99 mg/dL 99  240  973   BUN 6 - 23 mg/dL 17  16  22    Creatinine 0.40 - 1.50 mg/dL  5.32  9.92   Sodium 135 - 145 mEq/L 139  137  140   Potassium 3.5 - 5.1 mEq/L 4.3  4.0  4.3   Chloride 96 - 112 mEq/L 104  106  100   CO2 19 - 32 mEq/L 24  21  21    Calcium 8.4 - 10.5 mg/dL 9.5  9.2  9.4     BNP No results found for: "BNP"  ProBNP    Component Value Date/Time   PROBNP 44.0 02/05/2022 1603    PFT No results found for: "FEV1PRE", "FEV1POST", "FVCPRE", "FVCPOST", "TLC", "DLCOUNC", "PREFEV1FVCRT", "PSTFEV1FVCRT"  DG Chest 2 View  Result Date: 02/05/2022 CLINICAL DATA:  Shortness of breath and wheezing. EXAM: CHEST - 2 VIEW COMPARISON:  Chest x-ray dated June 23, 2021. FINDINGS: The heart size and mediastinal contours are within normal limits. Both lungs are clear. The visualized skeletal structures are unremarkable. IMPRESSION: No active cardiopulmonary disease. Electronically Signed   By: Obie Dredge M.D.   On: 02/05/2022 15:49     Past medical hx Past Medical History:  Diagnosis Date   COPD (chronic obstructive pulmonary disease) with emphysema (HCC)    Esophageal reflux    Exertional shortness of breath    "just today" (09/27/2013)   Hypertension      Social History   Tobacco Use   Smoking status: Former    Packs/day: 1.00    Years: 30.00    Total pack years: 30.00    Types: Cigarettes    Quit date: 05/18/2007    Years since quitting: 14.8   Smokeless tobacco: Never  Substance Use Topics   Alcohol use: Yes    Alcohol/week: 6.0 standard drinks of alcohol    Types: 6 Cans of beer per week    Comment: 09/27/2013 "maybe a 6 pack of beer on the weekends"   Drug use: No    [name] reports that he quit smoking about 14 years ago. His smoking use included cigarettes. He has a  30.00 pack-year smoking history. He has never used smokeless tobacco. He reports current alcohol use of about 6.0 standard drinks of alcohol per week. He reports that he does not use drugs.  Tobacco Cessation: Former smoker with a 30 pack year smoking history, quit 2008   Past surgical hx, Family hx, Social hx all reviewed.  Current Outpatient Medications on File Prior to Visit  Medication Sig   albuterol (PROAIR HFA) 108 (90 Base) MCG/ACT inhaler 2 puffs every 4 hours as needed only  if your can't catch your breath   aspirin 81 MG tablet Take 81 mg by mouth daily.   BREZTRI AEROSPHERE 160-9-4.8 MCG/ACT AERO INHALE 2 PUFFS INTO THE LUNGS 2 TIMES DAILY   Cholecalciferol (VITAMIN D-3) 5000 units TABS Take 1 tablet by mouth daily.   fish oil-omega-3 fatty acids 1000 MG capsule Take 1 g by mouth daily.   irbesartan (AVAPRO) 150 MG tablet Take 1 tablet (150 mg total) by mouth daily.   Multiple Vitamin (MULTIVITAMIN WITH MINERALS) TABS tablet Take 1 tablet by mouth daily.   predniSONE (DELTASONE) 10 MG tablet 4 x 2 days, 2 x 2 days, 1 x 2 days, then stop   Turmeric 500 MG CAPS Take 1 tablet by mouth daily.   No current facility-administered medications on file prior to visit.     No Known Allergies  Review Of Systems:  Constitutional:   No  weight loss, night sweats,  Fevers, chills, fatigue, or  lassitude.  HEENT:   No headaches,  Difficulty swallowing,  Tooth/dental problems, or  Sore throat,                No sneezing, itching, ear ache, nasal congestion, post nasal drip,   CV:  No chest pain,  Orthopnea, PND, swelling in lower extremities, anasarca, dizziness, palpitations, syncope.   GI  No heartburn, indigestion, abdominal pain, nausea, vomiting, diarrhea, change in bowel habits, loss of appetite, bloody stools.   Resp: No shortness of breath with exertion or at rest.  No excess mucus, no productive cough,  No non-productive cough,  No coughing up of blood.  No change in color of  mucus.  No wheezing.  No chest wall deformity  Skin: no rash or lesions.  GU: no dysuria, change  in color of urine, no urgency or frequency.  No flank pain, no hematuria   MS:  No joint pain or swelling.  No decreased range of motion.  No back pain.  Psych:  No change in mood or affect. No depression or anxiety.  No memory loss.   Vital Signs BP (!) 150/90 (BP Location: Left Arm, Patient Position: Sitting, Cuff Size: Large)   Pulse 78   Temp 98.7 F (37.1 C) (Oral)   Ht 5\' 8"  (1.727 m)   Wt 237 lb (107.5 kg)   SpO2 95%   BMI 36.04 kg/m    Physical Exam:  General- No distress,  A&Ox3, pleasant ENT: No sinus tenderness, TM clear, pale nasal mucosa, no oral exudate,no post nasal drip, no LAN Cardiac: S1, S2, regular rate and rhythm, no murmur Chest: No wheeze/ rales/ dullness; no accessory muscle use, no nasal flaring, no sternal retractions Abd.: Soft Non-tender, ND, BS +, Body mass index is 36.04 kg/m.  Ext: No clubbing cyanosis, edema Neuro:  normal strength, MAE x 4, A&O x 3 Skin: No rashes, warm and dry, no lesions  Psych: normal mood and behavior   Assessment/Plan Resolving COPD Flare Treated with Azithro x 1 and pred taper x 2 Plan Finish your prednisone taper as prescribed  PFT's 05/2022 ( Pulmonary Function Test)  We will cancel 9/15 appointment with Dr. 10/15 , and reschedule after PFT's ( schedule in 05/2022) Be aware of [place] days.  Stay inside. We will do a CT Chest if you continue to have flares to take a close look as to what may be causing them.  Call 06/2022 if you flare again or need [place] sooner Note your daily symptoms > remember "red flags" for COPD:  Increase in cough, increase in sputum production, increase in shortness of breath or activity intolerance. If you notice these symptoms, please call to be seen.     I spent 40 minutes dedicated to the care of this patient on the date of this encounter to include pre-visit review of records, face-to-face  time with the patient discussing conditions above, post visit ordering of testing, clinical documentation with the electronic health record, making appropriate referrals as documented, and communicating necessary information to the patient's healthcare team.   [place], NP 03/06/2022  12:20 PM

## 2022-03-06 NOTE — Patient Instructions (Signed)
It is good to see you today. Finish your prednisone taper as prescribed  PFT's 05/2022 ( Pulmonary Function Test)  We will cancel 9/15 appointment with Dr. Thora Lance , and reschedule after PFT's ( schedule in 05/2022) Be aware of [place] days.  Stay inside. We will do a CT Chest if you continue to have flares to take a close look as to what may be causing them.  Call us if you flare again or need [place] sooner Note your daily symptoms > remember "red flags" for COPD:  Increase in cough, increase in sputum production, increase in shortness of breath or activity intolerance. If you notice these symptoms, please call to be seen.

## 2022-03-09 IMAGING — DX DG CHEST 2V
2 series · 2 of 2 positions shown · non-contrast
Comparison: Radiograph 12/01/2017

CLINICAL DATA: Acute bronchitis, rhonchi right base

EXAM:
CHEST - 2 VIEW

[chest pa]
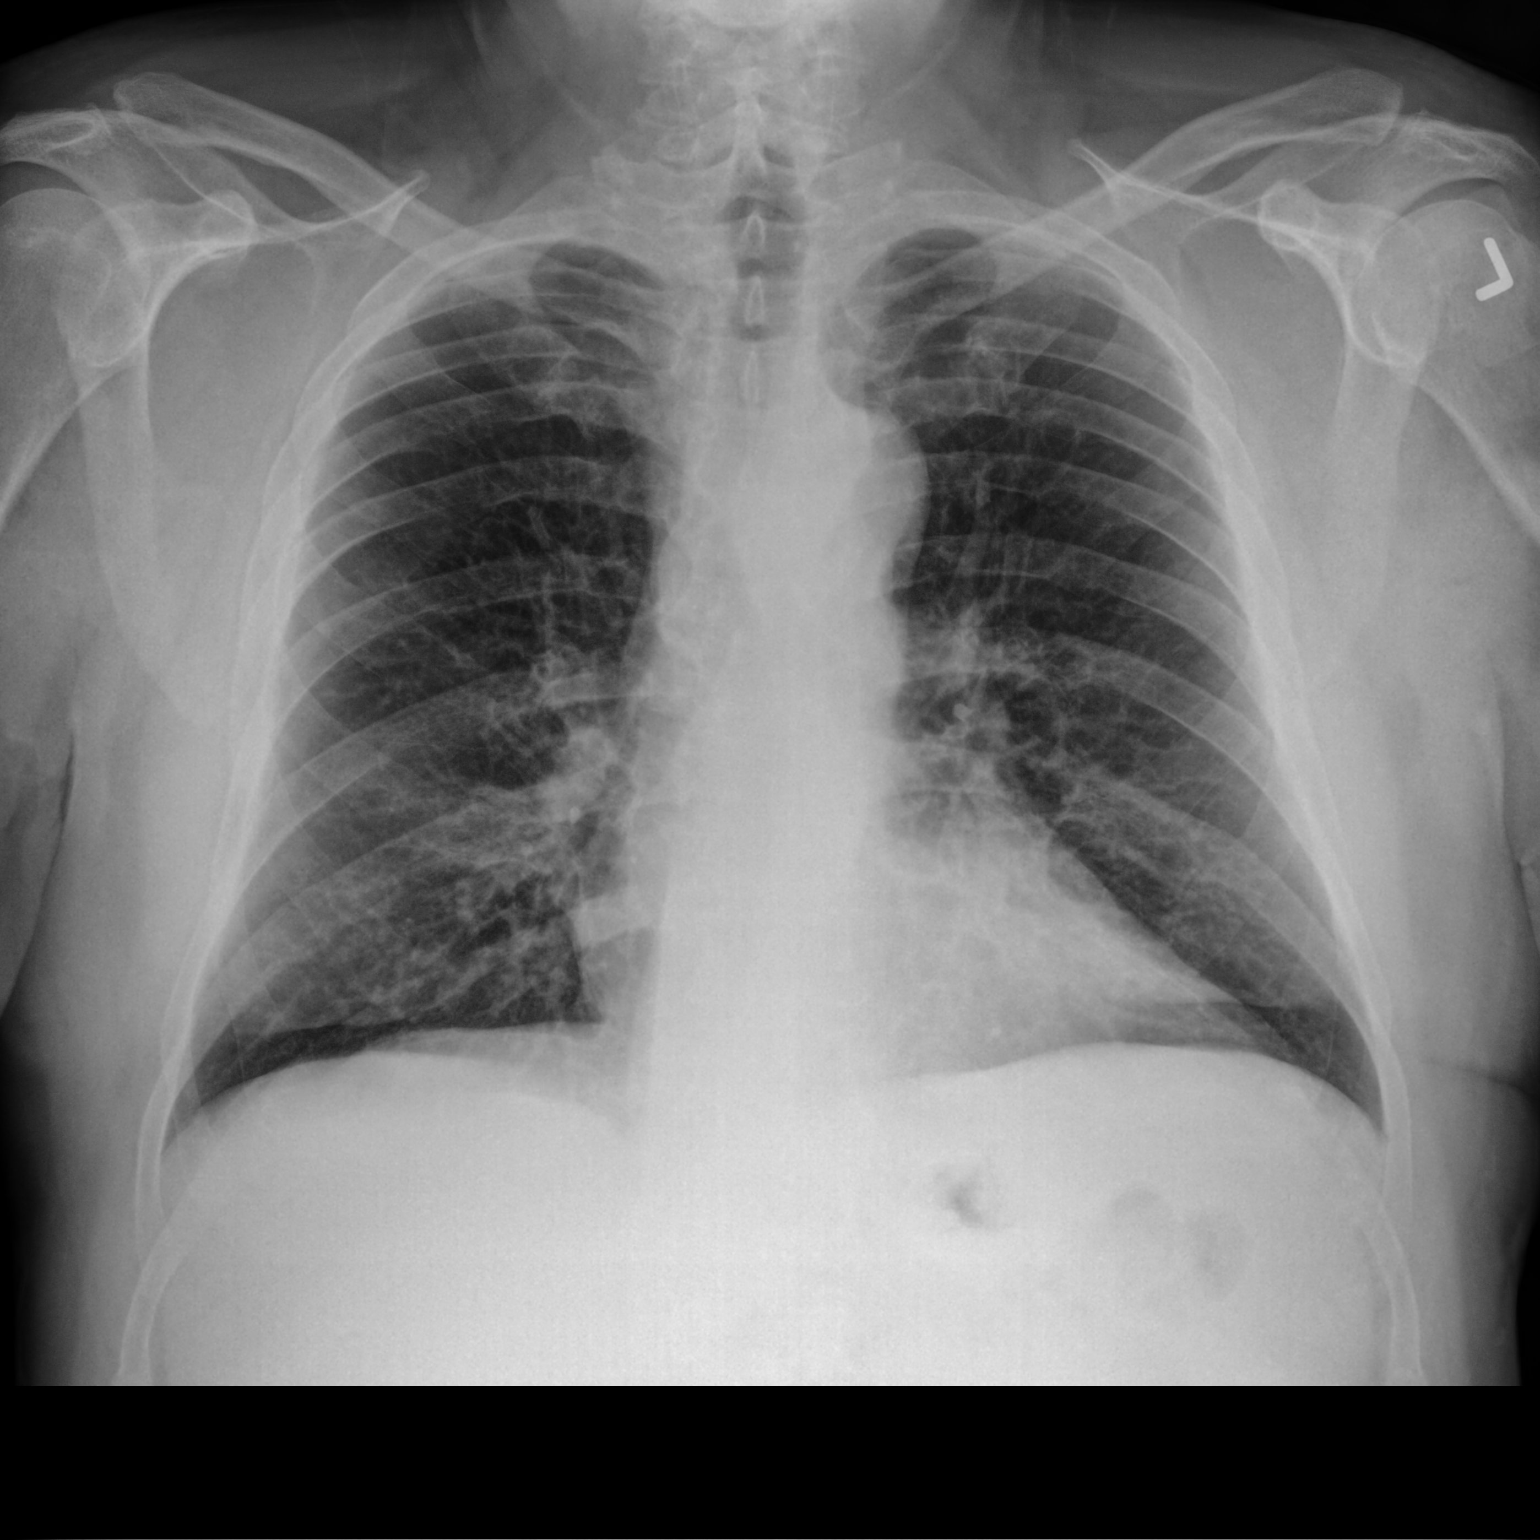

[chest lat]
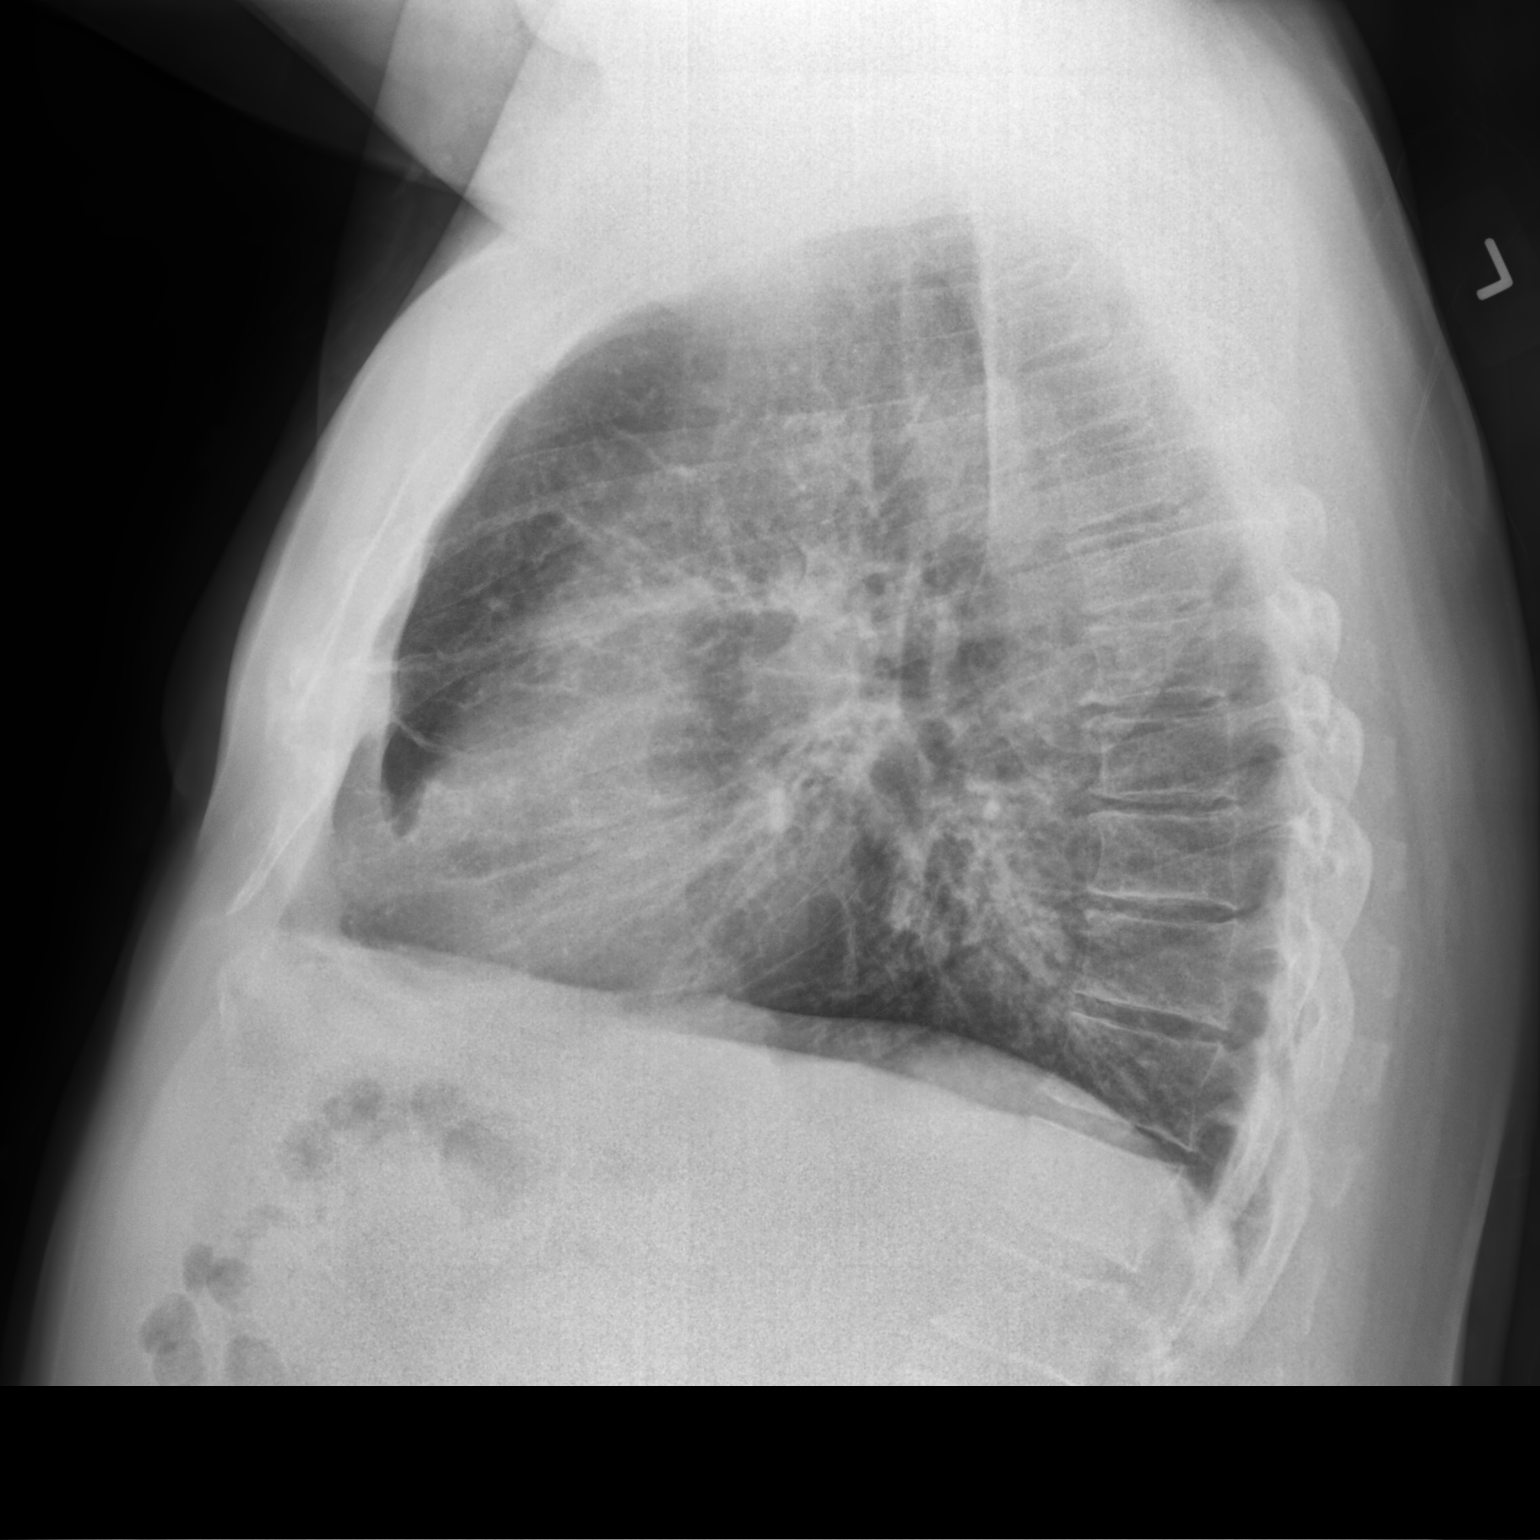

[2 of 2 positions shown; findings below may reference images not displayed]

FINDINGS: Unchanged cardiomediastinal silhouette. There is mild bilateral
perihilar peribronchial thickening and slight increased opacities in
the right middle lobe and lingula. No large pleural effusion or
visible pneumothorax. There is no acute osseous abnormality.
Thoracic spondylosis.
IMPRESSION: Findings are suggestive of bronchitis, with slight increased
opacities in the right middle lobe and lingula which could represent
developing superimposed pneumonia.

## 2022-03-13 ENCOUNTER — Telehealth: Payer: Self-pay | Admitting: Internal Medicine

## 2022-03-13 ENCOUNTER — Other Ambulatory Visit: Payer: Self-pay | Admitting: Acute Care

## 2022-03-13 DIAGNOSIS — R062 Wheezing: Secondary | ICD-10-CM

## 2022-03-13 MED ORDER — PREDNISONE 10 MG PO TABS: ORAL_TABLET | ORAL | 0 refills | Status: DC

## 2022-03-13 NOTE — Telephone Encounter (Signed)
Called and spoke with patient. He was seen by SG on 03/06/22 and was advised to finish the prednisone taper that MW sent in on 03/03/22. He felt well for about 2 days but his symptoms have returned. Her had a friend who is a nurse listen to his lungs and she heard rattling and wheezing. He has also noticed that he has been wheezing more. Denied having a cough or fever. He attempted to take a shower this morning and he was completely winded afterwards.   He said he was told to call back if he wasn't feeling any better by the end of the week.   [facility]   Maralyn Sago, can you please advise? Thanks!

## 2022-03-13 NOTE — Progress Notes (Signed)
I had a call from Mr. Belfield. He states as soon as he finishes prednisone ( Last prescribed by Dr. Sherene Sires on 03/03/2022)  He develops worsening shortness of breath.  Shortness of breath today was worse than normal.  He was last prescribed prednisone on 03/03/2022 .  The taper was for 10 mg tablets x2 days to 10 mg tablets x2 days 1 T10 mg tablet x2 days.  He completed his most recent taper on 7/23.  He has been off prednisone for 5 days and has called back stating his dyspnea is worse.  I told him he needs to go to the emergency room or an urgent care.  Patient has been concerned as he is only currently covered by the [employer] health plan and Medicare does not kick in for him until September.  I explained to him that if he has health issues requiring emergent care he will need to seek care at an urgent care or an emergency room.  He is concerned about the cost. The patient needs a CT of his chest.  I have sent in an additional prednisone taper to get him through the weekend.  He has a follow-up appointment with me on Tuesday at 11 AM where I will evaluate him, draw D-dimer, and order CT chest. I have told him if he gets any worse over the weekend he is to immediately go to the emergency room and seek emergency care.  He denies fever, chest pain, orthopnea.  He just states he has worsening shortness of breath.  He denies any leg pain, back pain, or chest pain.  Again I reiterated he needs to seek emergency care if he does not get better on the additional prednisone or develops worsening symptoms despite prednisone.  He verbalized understanding.  Prednisone has been sent to family pharmacy per his request.

## 2022-03-13 NOTE — Telephone Encounter (Signed)
See additional encounter from 03/13/22. Patient has been scheduled for an OV with Sarah on 08/01 at 11am.   Nothing further needed at time of call.

## 2022-03-17 ENCOUNTER — Encounter: Payer: Self-pay | Admitting: Acute Care

## 2022-03-17 ENCOUNTER — Ambulatory Visit (INDEPENDENT_AMBULATORY_CARE_PROVIDER_SITE_OTHER): Payer: 59 | Admitting: Acute Care

## 2022-03-17 VITALS — BP 142/90 | HR 80 | Temp 98.5°F | Ht 68.0 in | Wt 234.4 lb

## 2022-03-17 DIAGNOSIS — R0600 Dyspnea, unspecified: Secondary | ICD-10-CM | POA: Diagnosis not present

## 2022-03-17 DIAGNOSIS — J441 Chronic obstructive pulmonary disease with (acute) exacerbation: Secondary | ICD-10-CM

## 2022-03-17 DIAGNOSIS — Z87891 Personal history of nicotine dependence: Secondary | ICD-10-CM

## 2022-03-17 MED ORDER — ALBUTEROL SULFATE (2.5 MG/3ML) 0.083% IN NEBU: 2.5000 mg | INHALATION_SOLUTION | Freq: Four times a day (QID) | RESPIRATORY_TRACT | 12 refills | Status: AC | PRN

## 2022-03-17 NOTE — Progress Notes (Signed)
History of Present Illness [name]. is a 66 y.o. male  former smoker ( Quit 2008 with a  30 pack year smoking history) with COPD Gold II  last FEV1 57%, GERD, seen in  pulmonary clinic for acute exacerbation of COPD on 02/05/2022. He is followed by [name] and [name].     03/17/2022 Pt. Presents for follow up. He was seen last week  7/21 for an acute visit with a slow to resolve COPD exacerbation. Initial exacerbation was 02/05/2022 and he was treated with a prednisone taper , Azithromycin, albuterol prn,. BNP was obtained which was  normal at 44.  No ankle swelling. CXR was done that was ? For a persistent RML opacity. Pt. Competed antibiotic and prednisone treatment and felt great x 3 weeks., then presented again 03/03/2022 with wheezing and shortness of breath. I  re-treated with a shorter coarse of prednisone , then he called me Friday  7/28 very short of breath again within a week of finishing his last prednisone taper. He was audibly wjheezing, so I sent in a short prednisone taper, and made sure he followed up in the office today for evaluation.He returns today states he feels better. He has another day of prednisone , so he is still being treated. It will be interesting to see if he re-flares after prednisone is completed.  Pt has gone years without a flare, and this has been a very slow to resolve flare. He has not had any spirometry since  2018. I have ordered PFT's for October, as he goes on Medicare at that time. I have told him if he continues to flare we may move that up. I have ordered a CT Chest now to ensure there is not a process other than COPD that may be the cause. 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  He denies fever, chest pain, orthopnea or hemoptysis. Cough is much better. He responds well to steroids.   He would like albuterol nebs when he flares. He has a neb machine already. I will order the nebs.   Test  Results: Spirometry 2018 Moderately severe obstruction, with low vital capacity.     Latest Ref Rng & Units 12/01/2017    1:03 PM 09/28/2013    6:45 AM 09/27/2013   12:30 PM  CBC  WBC 4.0 - 10.5 K/uL 9.3  16.4  12.0   Hemoglobin 13.0 - 17.0 g/dL 74.9  44.9  67.5   Hematocrit 39.0 - 52.0 % 46.0  44.6  46.0   Platelets 150 - 400 K/uL 229  270  229        Latest Ref Rng & Units 02/05/2022    4:03 PM 12/01/2017    1:03 PM 09/28/2013    6:45 AM  BMP  Glucose 70 - 99 mg/dL 99  916  384   BUN 6 - 23 mg/dL 17  16  22    Creatinine 0.40 - 1.50 mg/dL  6.65  9.93   Sodium 135 - 145 mEq/L 139  137  140   Potassium 3.5 - 5.1 mEq/L 4.3  4.0  4.3   Chloride 96 - 112 mEq/L 104  106  100   CO2 19 - 32 mEq/L 24  21  21    Calcium 8.4 - 10.5 mg/dL 9.5  9.2  9.4     BNP No results found for: "BNP"  ProBNP  Component Value Date/Time   PROBNP 44.0 02/05/2022 1603    PFT No results found for: "FEV1PRE", "FEV1POST", "FVCPRE", "FVCPOST", "TLC", "DLCOUNC", "PREFEV1FVCRT", "PSTFEV1FVCRT"  No results found.   Past medical hx Past Medical History:  Diagnosis Date   COPD (chronic obstructive pulmonary disease) with emphysema (HCC)    Esophageal reflux    Exertional shortness of breath    "just today" (09/27/2013)   Hypertension      Social History   Tobacco Use   Smoking status: Former    Packs/day: 1.00    Years: 30.00    Total pack years: 30.00    Types: Cigarettes    Quit date: 05/18/2007    Years since quitting: 14.8   Smokeless tobacco: Never  Substance Use Topics   Alcohol use: Yes    Alcohol/week: 6.0 standard drinks of alcohol    Types: 6 Cans of beer per week    Comment: 09/27/2013 "maybe a 6 pack of beer on the weekends"   Drug use: No    DalePeterson reports that he quit smoking about 14 years ago. His smoking use included cigarettes. He has a 30.00 pack-year smoking history. He has never used smokeless tobacco. He reports current alcohol use of about 6.0 standard  drinks of alcohol per week. He reports that he does not use drugs.  Tobacco Cessation: Former smoker ( Quit 2008 with a  30 pack year smoking history)    Past surgical hx, Family hx, Social hx all reviewed.  Current Outpatient Medications on File Prior to Visit  Medication Sig   albuterol (PROAIR HFA) 108 (90 Base) MCG/ACT inhaler 2 puffs every 4 hours as needed only  if your can't catch your breath   aspirin 81 MG tablet Take 81 mg by mouth daily.   BREZTRI AEROSPHERE 160-9-4.8 MCG/ACT AERO INHALE 2 PUFFS INTO THE LUNGS 2 TIMES DAILY   Cholecalciferol (VITAMIN D-3) 5000 units TABS Take 1 tablet by mouth daily.   fish oil-omega-3 fatty acids 1000 MG capsule Take 1 g by mouth daily.   irbesartan (AVAPRO) 150 MG tablet Take 1 tablet (150 mg total) by mouth daily.   Multiple Vitamin (MULTIVITAMIN WITH MINERALS) TABS tablet Take 1 tablet by mouth daily.   predniSONE (DELTASONE) 10 MG tablet 4 x 2 days, 2 x 2 days, 1 x 2 days, then stop   predniSONE (DELTASONE) 10 MG tablet Prednisone taper; 10 mg tablets: 4 tabs x 2 days, 3 tabs x 2 days, 2 tabs x 2 days 1 tab x 2 days then stop.   Turmeric 500 MG CAPS Take 1 tablet by mouth daily.   No current facility-administered medications on file prior to visit.     No Known Allergies  Review Of Systems:  Constitutional:   No  weight loss, night sweats,  Fevers, chills, fatigue, or  lassitude.  HEENT:   No headaches,  Difficulty swallowing,  Tooth/dental problems, or  Sore throat,                No sneezing, itching, ear ache, nasal congestion, post nasal drip,   CV:  No chest pain,  Orthopnea, PND, swelling in lower extremities, anasarca, dizziness, palpitations, syncope.   GI  No heartburn, indigestion, abdominal pain, nausea, vomiting, diarrhea, change in bowel habits, loss of appetite, bloody stools.   Resp: + shortness of breath with exertion or at rest.  + excess mucus, no productive cough,  + non-productive cough,  No coughing up of  blood.  No change  in color of mucus.  + wheezing.  No chest wall deformity  Skin: no rash or lesions.  GU: no dysuria, change in color of urine, no urgency or frequency.  No flank pain, no hematuria   MS:  No joint pain or swelling.  No decreased range of motion.  No back pain.  Psych:  No change in mood or affect. No depression or anxiety.  No memory loss.   Vital Signs BP (!) 142/90 (BP Location: Right Arm, Patient Position: Sitting, Cuff Size: Normal)   Pulse 80   Temp 98.5 F (36.9 C) (Oral)   Ht 5\' 8"  (1.727 m)   Wt 234 lb 6.4 oz (106.3 kg)   SpO2 91%   BMI 35.64 kg/m    Physical Exam:  General- No distress,  A&Ox3 ENT: No sinus tenderness, TM clear, pale nasal mucosa, no oral exudate,no post nasal drip, no LAN Cardiac: S1, S2, regular rate and rhythm, no murmur Chest: No wheeze/ rales/ dullness; no accessory muscle use, no nasal flaring, no sternal retractions Abd.: Soft Non-tender Ext: No clubbing cyanosis, edema Neuro:  normal strength Skin: No rashes, warm and dry Psych: normal mood and behavior   Assessment/Plan Very Slow to resolve COPD exacerbation in patient with rare exacerbation history Plan We will order a CT Chest without contrast of your lungs.  You will get a call to schedule this.  I will call you with the results. We will place an order for albuterol nebs.  Use these as needed for breakthrough shortness of breath , but no more than 4 times daily If you need the nebs > 4 times daily please call to be seen.  Continue Breztri 2 puffs twice daily. Rinse mouth after use. Follow up in 1 month with Dr. or Meier Please contact office for sooner follow up if symptoms do not improve or worsen or seek emergency care   I spent 40 minutes dedicated to the care of this patient on the date of this encounter to include pre-visit review of records, face-to-face time with the patient discussing conditions above, post visit ordering of testing, clinical  documentation with the electronic health record, making appropriate referrals as documented, and communicating necessary information to the patient's healthcare team.    Sherene Sires, NP 03/17/2022  11:16 AM

## 2022-03-17 NOTE — Patient Instructions (Addendum)
It is good to see you today.  We will order a CT Chest without contrast of your lungs.  You will get a call to schedule this.  I will call you with the results. We will place an order for albuterol nebs.  Use these as needed for breakthrough shortness of breath , but no more than 4 times daily If you need the nebs > 4 times daily please call to be seen.  Continue Breztri 2 puffs twice daily. Rinse mouth after use. Follow up in 1 month with Dr. Sherene Sires or Meier Please contact office for sooner follow up if symptoms do not improve or worsen or seek emergency care

## 2022-03-24 ENCOUNTER — Emergency Department (HOSPITAL_COMMUNITY): Payer: 59

## 2022-03-24 ENCOUNTER — Encounter (HOSPITAL_COMMUNITY): Payer: Self-pay

## 2022-03-24 ENCOUNTER — Other Ambulatory Visit: Payer: Self-pay

## 2022-03-24 ENCOUNTER — Inpatient Hospital Stay (HOSPITAL_COMMUNITY)
Admission: EM | Admit: 2022-03-24 | Discharge: 2022-03-26 | DRG: 190 | Disposition: A | Discharge status: Home / Self Care | Payer: 59 | Attending: Internal Medicine | Admitting: Internal Medicine

## 2022-03-24 DIAGNOSIS — Z7982 Long term (current) use of aspirin: Secondary | ICD-10-CM

## 2022-03-24 DIAGNOSIS — J432 Centrilobular emphysema: Secondary | ICD-10-CM | POA: Diagnosis present

## 2022-03-24 DIAGNOSIS — E785 Hyperlipidemia, unspecified: Secondary | ICD-10-CM | POA: Diagnosis present

## 2022-03-24 DIAGNOSIS — D72829 Elevated white blood cell count, unspecified: Secondary | ICD-10-CM | POA: Diagnosis present

## 2022-03-24 DIAGNOSIS — J9600 Acute respiratory failure, unspecified whether with hypoxia or hypercapnia: Secondary | ICD-10-CM

## 2022-03-24 DIAGNOSIS — E669 Obesity, unspecified: Secondary | ICD-10-CM | POA: Diagnosis present

## 2022-03-24 DIAGNOSIS — Z79899 Other long term (current) drug therapy: Secondary | ICD-10-CM | POA: Diagnosis not present

## 2022-03-24 DIAGNOSIS — Z7952 Long term (current) use of systemic steroids: Secondary | ICD-10-CM | POA: Diagnosis not present

## 2022-03-24 DIAGNOSIS — Z20822 Contact with and (suspected) exposure to covid-19: Secondary | ICD-10-CM | POA: Diagnosis present

## 2022-03-24 DIAGNOSIS — I10 Essential (primary) hypertension: Secondary | ICD-10-CM | POA: Diagnosis present

## 2022-03-24 DIAGNOSIS — Z888 Allergy status to other drugs, medicaments and biological substances status: Secondary | ICD-10-CM

## 2022-03-24 DIAGNOSIS — T380X5A Adverse effect of glucocorticoids and synthetic analogues, initial encounter: Secondary | ICD-10-CM | POA: Diagnosis not present

## 2022-03-24 DIAGNOSIS — J441 Chronic obstructive pulmonary disease with (acute) exacerbation: Secondary | ICD-10-CM | POA: Diagnosis not present

## 2022-03-24 DIAGNOSIS — K219 Gastro-esophageal reflux disease without esophagitis: Secondary | ICD-10-CM | POA: Diagnosis present

## 2022-03-24 DIAGNOSIS — Z6834 Body mass index (BMI) 34.0-34.9, adult: Secondary | ICD-10-CM | POA: Diagnosis not present

## 2022-03-24 DIAGNOSIS — F17220 Nicotine dependence, chewing tobacco, uncomplicated: Secondary | ICD-10-CM | POA: Diagnosis present

## 2022-03-24 DIAGNOSIS — Y92009 Unspecified place in unspecified non-institutional (private) residence as the place of occurrence of the external cause: Secondary | ICD-10-CM

## 2022-03-24 DIAGNOSIS — J9601 Acute respiratory failure with hypoxia: Secondary | ICD-10-CM | POA: Diagnosis not present

## 2022-03-24 DIAGNOSIS — J438 Other emphysema: Secondary | ICD-10-CM | POA: Diagnosis present

## 2022-03-24 DIAGNOSIS — Z716 Tobacco abuse counseling: Secondary | ICD-10-CM

## 2022-03-24 DIAGNOSIS — Z720 Tobacco use: Secondary | ICD-10-CM

## 2022-03-24 DIAGNOSIS — R739 Hyperglycemia, unspecified: Secondary | ICD-10-CM | POA: Diagnosis present

## 2022-03-24 DIAGNOSIS — J449 Chronic obstructive pulmonary disease, unspecified: Secondary | ICD-10-CM | POA: Diagnosis not present

## 2022-03-24 LAB — COMPREHENSIVE METABOLIC PANEL
ALT: 31 U/L (ref 0–44)
AST: 21 U/L (ref 15–41)
Albumin: 4.2 g/dL (ref 3.5–5.0)
Alkaline Phosphatase: 61 U/L (ref 38–126)
Anion gap: 9 (ref 5–15)
BUN: 18 mg/dL (ref 8–23)
CO2: 23 mmol/L (ref 22–32)
Calcium: 9.1 mg/dL (ref 8.9–10.3)
Chloride: 107 mmol/L (ref 98–111)
Creatinine, Ser: 0.96 mg/dL (ref 0.61–1.24)
GFR, Estimated: >60 mL/min (ref >60)
Glucose, Bld: 153 mg/dL — ABNORMAL HIGH (ref 70–99)
Potassium: 3.8 mmol/L (ref 3.5–5.1)
Sodium: 139 mmol/L (ref 135–145)
Total Bilirubin: 1.1 mg/dL (ref 0.3–1.2)
Total Protein: 7.8 g/dL (ref 6.5–8.1)

## 2022-03-24 LAB — BLOOD GAS, VENOUS
Acid-base deficit: 1.7 mmol/L (ref 0.0–2.0)
Bicarbonate: 24.8 mmol/L (ref 20.0–28.0)
O2 Saturation: 94 %
Patient temperature: 37
pCO2, Ven: 47 mmHg (ref 44–60)
pH, Ven: 7.33 (ref 7.25–7.43)
pO2, Ven: 70 mmHg — ABNORMAL HIGH (ref 32–45)

## 2022-03-24 LAB — CBC
HCT: 46.7 % (ref 39.0–52.0)
Hemoglobin: 15.6 g/dL (ref 13.0–17.0)
MCH: 29.9 pg (ref 26.0–34.0)
MCHC: 33.4 g/dL (ref 30.0–36.0)
MCV: 89.6 fL (ref 80.0–100.0)
Platelets: 195 10*3/uL (ref 150–400)
RBC: 5.21 MIL/uL (ref 4.22–5.81)
RDW: 13.6 % (ref 11.5–15.5)
WBC: 13.5 10*3/uL — ABNORMAL HIGH (ref 4.0–10.5)
nRBC: 0 % (ref 0.0–0.2)

## 2022-03-24 LAB — RESPIRATORY PANEL BY PCR

## 2022-03-24 LAB — CBC WITH DIFFERENTIAL/PLATELET
Abs Immature Granulocytes: 0.09 10*3/uL — ABNORMAL HIGH (ref 0.00–0.07)
Basophils Absolute: 0 10*3/uL (ref 0.0–0.1)
Basophils Relative: 0 %
Eosinophils Absolute: 0.6 10*3/uL — ABNORMAL HIGH (ref 0.0–0.5)
Eosinophils Relative: 6 %
HCT: 48.8 % (ref 39.0–52.0)
Hemoglobin: 16.4 g/dL (ref 13.0–17.0)
Immature Granulocytes: 1 %
Lymphocytes Relative: 19 %
Lymphs Abs: 1.9 10*3/uL (ref 0.7–4.0)
MCH: 30.1 pg (ref 26.0–34.0)
MCHC: 33.6 g/dL (ref 30.0–36.0)
MCV: 89.7 fL (ref 80.0–100.0)
Monocytes Absolute: 1.1 10*3/uL — ABNORMAL HIGH (ref 0.1–1.0)
Monocytes Relative: 10 %
Neutro Abs: 6.6 10*3/uL (ref 1.7–7.7)
Neutrophils Relative %: 64 %
Platelets: 176 10*3/uL (ref 150–400)
RBC: 5.44 MIL/uL (ref 4.22–5.81)
RDW: 13.7 % (ref 11.5–15.5)
WBC: 10.3 10*3/uL (ref 4.0–10.5)
nRBC: 0 % (ref 0.0–0.2)

## 2022-03-24 LAB — RESP PANEL BY RT-PCR (FLU A&B, COVID) ARPGX2
Influenza A by PCR: NEGATIVE
Influenza B by PCR: NEGATIVE
SARS Coronavirus 2 by RT PCR: NEGATIVE

## 2022-03-24 LAB — CREATININE, SERUM
Creatinine, Ser: 1.17 mg/dL (ref 0.61–1.24)
GFR, Estimated: >60 mL/min (ref >60)

## 2022-03-24 LAB — TROPONIN I (HIGH SENSITIVITY)
Troponin I (High Sensitivity): 4 ng/L (ref <18)
Troponin I (High Sensitivity): 4 ng/L (ref <18)

## 2022-03-24 LAB — HEMOGLOBIN A1C
Hgb A1c MFr Bld: 5.4 % (ref 4.8–5.6)
Mean Plasma Glucose: 108.28 mg/dL

## 2022-03-24 LAB — HIV ANTIBODY (ROUTINE TESTING W REFLEX): HIV Screen 4th Generation wRfx: NONREACTIVE

## 2022-03-24 LAB — BRAIN NATRIURETIC PEPTIDE: B Natriuretic Peptide: 16.5 pg/mL (ref 0.0–100.0)

## 2022-03-24 MED ORDER — ENOXAPARIN SODIUM 40 MG/0.4ML IJ SOSY
40.0000 mg | PREFILLED_SYRINGE | Freq: Every day | INTRAMUSCULAR | Status: DC
  Administered 2022-03-24 – 2022-03-25 (×2): 40 mg via SUBCUTANEOUS
  Filled 2022-03-24 (×2): qty 0.4

## 2022-03-24 MED ORDER — IPRATROPIUM-ALBUTEROL 0.5-2.5 (3) MG/3ML IN SOLN
3.0000 mL | Freq: Four times a day (QID) | RESPIRATORY_TRACT | Status: DC
Start: 2022-03-24 — End: 2022-03-24

## 2022-03-24 MED ORDER — BUDESONIDE 0.25 MG/2ML IN SUSP
0.2500 mg | Freq: Two times a day (BID) | RESPIRATORY_TRACT | Status: DC
  Administered 2022-03-24: 0.25 mg via RESPIRATORY_TRACT
  Filled 2022-03-24: qty 2

## 2022-03-24 MED ORDER — IRBESARTAN 150 MG PO TABS
150.0000 mg | ORAL_TABLET | Freq: Every day | ORAL | Status: DC
Start: 2022-03-24 — End: 2022-03-26
  Administered 2022-03-24 – 2022-03-26 (×3): 150 mg via ORAL
  Filled 2022-03-24 (×3): qty 1

## 2022-03-24 MED ORDER — ARFORMOTEROL TARTRATE 15 MCG/2ML IN NEBU
15.0000 ug | INHALATION_SOLUTION | Freq: Two times a day (BID) | RESPIRATORY_TRACT | Status: DC
  Filled 2022-03-24: qty 2

## 2022-03-24 MED ORDER — PREDNISONE 20 MG PO TABS: 40.0000 mg | ORAL_TABLET | Freq: Every day | ORAL | Status: DC

## 2022-03-24 MED ORDER — ASPIRIN 81 MG PO CHEW
81.0000 mg | CHEWABLE_TABLET | Freq: Every day | ORAL | Status: DC
  Administered 2022-03-24 – 2022-03-26 (×3): 81 mg via ORAL
  Filled 2022-03-24 (×3): qty 1

## 2022-03-24 MED ORDER — ACETAMINOPHEN 650 MG RE SUPP: 650.0000 mg | Freq: Four times a day (QID) | RECTAL | Status: DC | PRN

## 2022-03-24 MED ORDER — PANTOPRAZOLE SODIUM 40 MG PO TBEC
40.0000 mg | DELAYED_RELEASE_TABLET | Freq: Every day | ORAL | Status: DC
  Administered 2022-03-24 – 2022-03-26 (×3): 40 mg via ORAL
  Filled 2022-03-24 (×3): qty 1

## 2022-03-24 MED ORDER — SODIUM CHLORIDE (PF) 0.9 % IJ SOLN
INTRAMUSCULAR | Status: AC
  Filled 2022-03-24: qty 50

## 2022-03-24 MED ORDER — IPRATROPIUM-ALBUTEROL 0.5-2.5 (3) MG/3ML IN SOLN
3.0000 mL | RESPIRATORY_TRACT | Status: DC
  Administered 2022-03-24 – 2022-03-26 (×13): 3 mL via RESPIRATORY_TRACT
  Filled 2022-03-24 (×13): qty 3

## 2022-03-24 MED ORDER — REVEFENACIN 175 MCG/3ML IN SOLN
175.0000 ug | Freq: Every day | RESPIRATORY_TRACT | Status: DC
Start: 2022-03-24 — End: 2022-03-24
  Filled 2022-03-24: qty 3

## 2022-03-24 MED ORDER — ADULT MULTIVITAMIN W/MINERALS CH
1.0000 | ORAL_TABLET | Freq: Every day | ORAL | Status: DC
  Administered 2022-03-24 – 2022-03-26 (×3): 1 via ORAL
  Filled 2022-03-24 (×3): qty 1

## 2022-03-24 MED ORDER — IPRATROPIUM-ALBUTEROL 0.5-2.5 (3) MG/3ML IN SOLN
9.0000 mL | Freq: Once | RESPIRATORY_TRACT | Status: AC
  Administered 2022-03-24: 9 mL via RESPIRATORY_TRACT
  Filled 2022-03-24: qty 9

## 2022-03-24 MED ORDER — ACETAMINOPHEN 325 MG PO TABS: 650.0000 mg | ORAL_TABLET | Freq: Four times a day (QID) | ORAL | Status: DC | PRN

## 2022-03-24 MED ORDER — IOHEXOL 350 MG/ML SOLN
100.0000 mL | Freq: Once | INTRAVENOUS | Status: AC | PRN
  Administered 2022-03-24: 100 mL via INTRAVENOUS

## 2022-03-24 MED ORDER — ARFORMOTEROL TARTRATE 15 MCG/2ML IN NEBU
15.0000 ug | INHALATION_SOLUTION | Freq: Two times a day (BID) | RESPIRATORY_TRACT | Status: DC
  Administered 2022-03-24 – 2022-03-26 (×4): 15 ug via RESPIRATORY_TRACT
  Filled 2022-03-24 (×5): qty 2

## 2022-03-24 MED ORDER — REVEFENACIN 175 MCG/3ML IN SOLN
175.0000 ug | Freq: Every day | RESPIRATORY_TRACT | Status: DC
  Administered 2022-03-25 – 2022-03-26 (×2): 175 ug via RESPIRATORY_TRACT
  Filled 2022-03-24 (×2): qty 3

## 2022-03-24 MED ORDER — VITAMIN D-3 125 MCG (5000 UT) PO TABS: 1.0000 | ORAL_TABLET | Freq: Every day | ORAL | Status: DC

## 2022-03-24 MED ORDER — METHYLPREDNISOLONE SODIUM SUCC 125 MG IJ SOLR
125.0000 mg | Freq: Once | INTRAMUSCULAR | Status: AC
  Administered 2022-03-24: 125 mg via INTRAVENOUS
  Filled 2022-03-24: qty 2

## 2022-03-24 MED ORDER — IPRATROPIUM-ALBUTEROL 0.5-2.5 (3) MG/3ML IN SOLN
3.0000 mL | Freq: Three times a day (TID) | RESPIRATORY_TRACT | Status: DC
  Administered 2022-03-24: 3 mL via RESPIRATORY_TRACT
  Filled 2022-03-24: qty 3

## 2022-03-24 MED ORDER — OMEGA-3-ACID ETHYL ESTERS 1 G PO CAPS
1.0000 g | ORAL_CAPSULE | Freq: Every day | ORAL | Status: DC
  Administered 2022-03-24 – 2022-03-26 (×3): 1 g via ORAL
  Filled 2022-03-24 (×3): qty 1

## 2022-03-24 MED ORDER — ALBUTEROL SULFATE (2.5 MG/3ML) 0.083% IN NEBU
2.5000 mg | INHALATION_SOLUTION | RESPIRATORY_TRACT | Status: DC | PRN
  Administered 2022-03-24 (×2): 2.5 mg via RESPIRATORY_TRACT
  Filled 2022-03-24: qty 3

## 2022-03-24 MED ORDER — GUAIFENESIN ER 600 MG PO TB12
1200.0000 mg | ORAL_TABLET | Freq: Two times a day (BID) | ORAL | Status: DC
  Administered 2022-03-24 – 2022-03-26 (×5): 1200 mg via ORAL
  Filled 2022-03-24 (×5): qty 2

## 2022-03-24 MED ORDER — METHYLPREDNISOLONE SODIUM SUCC 125 MG IJ SOLR
125.0000 mg | Freq: Once | INTRAMUSCULAR | Status: DC
  Filled 2022-03-24: qty 2

## 2022-03-24 MED ORDER — BUDESONIDE 0.25 MG/2ML IN SUSP
0.2500 mg | Freq: Two times a day (BID) | RESPIRATORY_TRACT | Status: DC
Start: 2022-03-24 — End: 2022-03-26
  Administered 2022-03-24 – 2022-03-26 (×4): 0.25 mg via RESPIRATORY_TRACT
  Filled 2022-03-24 (×4): qty 2

## 2022-03-24 MED ORDER — VITAMIN D 25 MCG (1000 UNIT) PO TABS
1000.0000 [IU] | ORAL_TABLET | Freq: Every day | ORAL | Status: DC
  Administered 2022-03-24 – 2022-03-26 (×3): 1000 [IU] via ORAL
  Filled 2022-03-24 (×3): qty 1

## 2022-03-24 MED ORDER — METHYLPREDNISOLONE SODIUM SUCC 125 MG IJ SOLR
80.0000 mg | Freq: Every day | INTRAMUSCULAR | Status: DC
  Administered 2022-03-24 – 2022-03-25 (×2): 80 mg via INTRAVENOUS
  Filled 2022-03-24 (×2): qty 2

## 2022-03-24 MED ORDER — OMEGA-3 FATTY ACIDS 1000 MG PO CAPS: 1.0000 g | ORAL_CAPSULE | Freq: Every day | ORAL | Status: DC

## 2022-03-24 NOTE — Consult Note (Signed)
NAME:  [name]., MRN:  [id number], DOB:  12-25-1955, LOS: 0 ADMISSION DATE:  03/24/2022, CONSULTATION DATE:  03/24/2022 REFERRING MD:  Dr. , CHIEF COMPLAINT: Pulm consult  History of Present Illness:  [name]. Is a 66 yo male with a past medical history significant for COPD Gold 2, former smoker, HTN, HLD, and GERD who presented to the emergency department 8/8 with complaints of shortness of breath.  Patient was admitted per [facility] service for treatment of COPD exasperation acute hypoxic respiratory failure.  Of note patient has been seen in the pulmonary clinic on multiple visits for similar presentation.  Patient was initially seen in clinic 02/05/2022 for acute COPD exacerbation at which time a prednisone taper and antibiotics were initiated.  BNP was slightly elevated and chest x-ray revealed persistent right middle lobe opacification.  Reports he felt much better with antibiotics and steroids but once steroid taper completed symptoms returned.  Seen again 7/21 where prednisone taper was extended and patient reported improvement in symptoms.  Patient had return visit 03/17/2022 and reported improvement in symptoms on prednisone taper.  Unfortunately symptoms returned and patient presented to ED 8/8.  Pertinent  Medical History  COPD Gold 2, former smoker, HTN, HLD, and GERD   Significant Hospital Events: Including procedures, antibiotic start and stop dates in addition to other pertinent events   8/8 presented with recurrent shortness of breath, admitted per [facility] for acute COPD exasperation and acute hypoxic respiratory failure.Marland Kitchen  PCCM consulted  Interim History / Subjective:  Seen lying in bed in mild respiratory distress, currently getting ned treatment  Wife at bedside   Objective   Blood pressure (!) 155/87, pulse 95, temperature 98.6 F (37 C), resp. rate 19, height 5\' 8"  (1.727 m), weight 104.3 kg, SpO2 94 %.    FiO2 (%):  [40 %] 40 %  No intake or output data in the 24  hours ending 03/24/22 0949 Filed Weights   03/24/22 0505  Weight: 104.3 kg    Examination: General: Acute ill appearing middle aged male lying in bed, in NAD HEENT: /AT, MM pink/moist, PERRL,  Neuro: Alert and oriented x3, non-focal  CV: s1s2 regular rate and rhythm, no murmur, rubs, or gallops,  PULM:  Diffuse bilaterally wheezing in all fields, mild increased work of breathing, on   GI: soft, bowel sounds active in all 4 quadrants, non-tender, non-distended, Extremities: warm/dry, no edema  Skin: no rashes or lesions  Resolved Hospital Problem list     Assessment & Plan:  Persistent COPD exasperation -CTA chest with centrilobular and paraseptal emphysema evident. No focal airspace consolidation. No pleural effusion or suspicious pulmonary nodule or mass -Appears that patient COPD may be steroid dependent  -Utilizes Breztri at baseline  Acute hypoxic respiratory failure -Initially required BiPAP therapy on admission but has since been transitioned to 3 L nasal cannula P: Wean supplemental oxygen to maintain SpO2 of 88-92% Continue Duonebs currently given diffuse wheezing Begin Yupelri/Brovana/Pulmicort with as needed albuterol when able  Prednisone 40mg  PO daily x5 days with slow taper to likely low dose maintance steroid  Continue to monitor WBC and fever curve no acute indications for antibiotics Obtain and follow cultures  BIPAP as needed Appropriate vaccinations prior to discharge   PCCM will continue to follow   Best Practice (right click and "Reselect all SmartList Selections" daily)  Per primary  Labs   CBC: Recent Labs  Lab 03/24/22 0510  WBC 10.3  NEUTROABS 6.6  HGB 16.4  HCT 48.8  MCV 89.7  PLT 0000000    Basic Metabolic Panel: Recent Labs  Lab 03/24/22 0510  NA 139  K 3.8  CL 107  CO2 23  GLUCOSE 153*  BUN 18  CREATININE 0.96  CALCIUM 9.1   GFR: Estimated Creatinine Clearance: 91 mL/min (by C-G formula based on SCr of 0.96  mg/dL). Recent Labs  Lab 03/24/22 0510  WBC 10.3    Liver Function Tests: Recent Labs  Lab 03/24/22 0510  AST 21  ALT 31  ALKPHOS 61  BILITOT 1.1  PROT 7.8  ALBUMIN 4.2   No results for input(s): "LIPASE", "AMYLASE" in the last 168 hours. No results for input(s): "AMMONIA" in the last 168 hours.  ABG    Component Value Date/Time   PHART 7.456 (H) 06/07/2007 0844   PCO2ART 35.3 06/07/2007 0844   PO2ART 62.0 (L) 06/07/2007 0844   HCO3 24.8 03/24/2022 0510   TCO2 26 06/07/2007 0844   ACIDBASEDEF 1.7 03/24/2022 0510   O2SAT 94 03/24/2022 0510     Coagulation Profile: No results for input(s): "INR", "PROTIME" in the last 168 hours.  Cardiac Enzymes: No results for input(s): "CKTOTAL", "CKMB", "CKMBINDEX", "TROPONINI" in the last 168 hours.  HbA1C: No results found for: "HGBA1C"  CBG: No results for input(s): "GLUCAP" in the last 168 hours.  Review of Systems:   Please see the history of present illness. All other systems reviewed and are negative   Past Medical History:  He,  has a past medical history of COPD (chronic obstructive pulmonary disease) with emphysema ([place]), Esophageal reflux, Exertional shortness of breath, and Hypertension.   Surgical History:   Past Surgical History:  Procedure Laterality Date   INGUINAL HERNIA REPAIR Left 2000's     Social History:   reports that he quit smoking about 14 years ago. His smoking use included cigarettes. He has a 30.00 pack-year smoking history. He has never used smokeless tobacco. He reports current alcohol use of about 6.0 standard drinks of alcohol per week. He reports that he does not use drugs.   Family History:  His family history is not on file.   Allergies Allergies  Allergen Reactions   Hydrochlorothiazide Other (See Comments)     Home Medications  Prior to Admission medications   Medication Sig Start Date End Date Taking? Authorizing Provider  albuterol (PROAIR HFA) 108 (90 Base) MCG/ACT  inhaler 2 puffs every 4 hours as needed only  if your can't catch your breath 10/15/21  Yes Tanda Rockers, MD  albuterol (PROVENTIL) (2.5 MG/3ML) 0.083% nebulizer solution Take 3 mLs (2.5 mg total) by nebulization every 6 (six) hours as needed for wheezing or shortness of breath. 03/17/22  Yes Magdalen Spatz, NP  aspirin 81 MG tablet Take 81 mg by mouth daily.   Yes [provider]  BREZTRI AEROSPHERE 160-9-4.8 MCG/ACT AERO INHALE 2 PUFFS INTO THE LUNGS 2 TIMES DAILY 10/08/21  Yes Tanda Rockers, MD  Cholecalciferol (VITAMIN D-3) 5000 units TABS Take 1 tablet by mouth daily.   Yes [provider]  fish oil-omega-3 fatty acids 1000 MG capsule Take 1 g by mouth daily.   Yes [provider]  irbesartan (AVAPRO) 150 MG tablet Take 1 tablet (150 mg total) by mouth daily. 12/25/16  Yes Tanda Rockers, MD  Multiple Vitamin (MULTIVITAMIN WITH MINERALS) TABS tablet Take 1 tablet by mouth daily.   Yes [provider]  pantoprazole (PROTONIX) 40 MG tablet Take 40 mg by mouth  daily. 02/19/22  Yes [provider]  Turmeric 500 MG CAPS Take 1 tablet by mouth daily.   Yes [provider]     Critical care time: NA  Ronnie Doo D. Tiburcio Pea, NP-C [facility] Personal contact information can be found on Amion  03/24/2022, 10:10 AM

## 2022-03-24 NOTE — ED Provider Notes (Signed)
  Physical Exam  BP (!) 150/88   Pulse 95   Temp 98.9 F (37.2 C) (Axillary)   Resp 19   Ht 1.727 m (5\' 8" )   Wt 104.3 kg   SpO2 94%   BMI 34.97 kg/m   Physical Exam Vitals reviewed.     Procedures  Procedures  ED Course / MDM   Clinical Course as of 03/24/22 0822  Tue Mar 24, 2022  0658 On bipap for copd exacerbation since June 20 Pe study pending. Had pneumonia appears to be improving. Will call for admission EMS mag Here steroids [DR]    Clinical Course User Index [DR] June 22, MD   Medical Decision Making Amount and/or Complexity of Data Reviewed Labs: ordered. Radiology: ordered.  Risk Prescription drug management.   66 year old male history of COPD presents today with respiratory failure.  Patient has had increased COPD symptoms since mid June.  He has been on several rounds of steroids.  He completed his last taper on Friday.  He states since Saturday his symptoms have worsened with increased dyspnea, cough, and wheezing.  He had to call EMS this morning and was placed on CPAP prior to arrival. Here in the ED he received Solu-Medrol.  Prehospital patient received mag.  He has had several DuoNebs. We have been able to remove the CPAP but patient has new oxygen requirement with 3 L nasal cannula and sats at 93% CTA shows no evidence of PE Patient continues to have diffuse expiratory wheezing Plan admission for ongoing treatment and evaluation Care discussed with Dr. Saturday who will see for admission        Ronaldo Miyamoto, MD 03/24/22 [phone]

## 2022-03-24 NOTE — Progress Notes (Signed)
Pt has a nebulizer at home but hasnt used it. Pt quit smoking 30 years ago. No O2 at home. Dr. Sherene Sires is his pulmonology. RT took Pt off BIPAP at [facility]

## 2022-03-24 NOTE — H&P (Signed)
History and Physical    Patient: [name]. NIO:[id number] DOB: 01/03/1956 DOA: 03/24/2022 DOS: the patient was seen and examined on 03/24/2022 PCP: Joycelyn Rua, MD  Patient coming from: Home  Chief Complaint:  Chief Complaint  Patient presents with   Shortness of Breath   HPI: Dewane Timson. is a 66 y.o. male with medical history significant of HTN, HLD, GERD, COPD. Presenting with shortness of breath. He reports about 6 weeks ago he was diagnosed with PNA w/ his out pulmonology team. He was started on abx and steroids. His symptoms greatly improved until his steroid taper was over. When the steroids finished, his symptoms returned. He visited his pulm team 2 more times since and the response pattern was the same: his symptoms would improve on steroids and then worsen as soon as the steroids were done. Yesterday he noticed cough productive of clear sputum. He also noticed that he was becoming more short of breath. He tried mucinex, a rescue inhaler, and nebs. However, nothing helped. When his symptoms worsened last night, he decided to come to the ED for assistance. He denies fevers, N/V/D, sick contacts. He denies any other aggravating or alleviating factors.    Review of Systems: As mentioned in the history of present illness. All other systems reviewed and are negative. Past Medical History:  Diagnosis Date   COPD (chronic obstructive pulmonary disease) with emphysema (HCC)    Esophageal reflux    Exertional shortness of breath    "just today" (09/27/2013)   Hypertension    Past Surgical History:  Procedure Laterality Date   INGUINAL HERNIA REPAIR Left 2000's   Social History:  reports that he quit smoking about 14 years ago. His smoking use included cigarettes. He has a 30.00 pack-year smoking history. He has never used smokeless tobacco. He reports current alcohol use of about 6.0 standard drinks of alcohol per week. He reports that he does not use drugs.  Allergies   Allergen Reactions   Hydrochlorothiazide Other (See Comments)    History reviewed. No pertinent family history.  Prior to Admission medications   Medication Sig Start Date End Date Taking? Authorizing Provider  albuterol (PROAIR HFA) 108 (90 Base) MCG/ACT inhaler 2 puffs every 4 hours as needed only  if your can't catch your breath 10/15/21  Yes Nyoka Cowden, MD  albuterol (PROVENTIL) (2.5 MG/3ML) 0.083% nebulizer solution Take 3 mLs (2.5 mg total) by nebulization every 6 (six) hours as needed for wheezing or shortness of breath. 03/17/22  Yes Bevelyn Ngo, NP  aspirin 81 MG tablet Take 81 mg by mouth daily.   Yes [provider]  BREZTRI AEROSPHERE 160-9-4.8 MCG/ACT AERO INHALE 2 PUFFS INTO THE LUNGS 2 TIMES DAILY 10/08/21  Yes Nyoka Cowden, MD  Cholecalciferol (VITAMIN D-3) 5000 units TABS Take 1 tablet by mouth daily.   Yes [provider]  fish oil-omega-3 fatty acids 1000 MG capsule Take 1 g by mouth daily.   Yes [provider]  irbesartan (AVAPRO) 150 MG tablet Take 1 tablet (150 mg total) by mouth daily. 12/25/16  Yes Nyoka Cowden, MD  Multiple Vitamin (MULTIVITAMIN WITH MINERALS) TABS tablet Take 1 tablet by mouth daily.   Yes [provider]  pantoprazole (PROTONIX) 40 MG tablet Take 40 mg by mouth daily. 02/19/22  Yes [provider]  Turmeric 500 MG CAPS Take 1 tablet by mouth daily.   Yes [provider]    Physical Exam: Vitals:  03/24/22 0645 03/24/22 0710 03/24/22 0715 03/24/22 0801  BP: 102/74  (!) 150/88   Pulse: 96 98 95   Resp: 17 20 19    Temp:      TempSrc:      SpO2: 96% 97% 97% 94%  Weight:      Height:       General: 66 y.o. male resting in bed in NAD Eyes: PERRL, normal sclera ENMT: Nares patent w/o discharge, orophaynx clear, dentition normal, ears w/o discharge/lesions/ulcers Neck: Supple, trachea midline Cardiovascular: tachy, +S1, S2, no m/g/r, equal pulses throughout Respiratory: diffuse b/l  exp/insp wheeze, no r/r, increased WOB in 3L GI: BS+, protuberant, soft, NT, no masses noted, no organomegaly noted MSK: No e/c/c Neuro: A&O x 3, no focal deficits Psyc: Appropriate interaction and affect, anxious, but cooperative  Data Reviewed:  Lab Results  Component Value Date   NA 139 03/24/2022   K 3.8 03/24/2022   CO2 23 03/24/2022   GLUCOSE 153 (H) 03/24/2022   BUN 18 03/24/2022   CREATININE 0.96 03/24/2022   CALCIUM 9.1 03/24/2022   GFRNONAA >60 03/24/2022   Lab Results  Component Value Date   ALT 31 03/24/2022   AST 21 03/24/2022   ALKPHOS 61 03/24/2022   BILITOT 1.1 03/24/2022   Lab Results  Component Value Date   WBC 10.3 03/24/2022   HGB 16.4 03/24/2022   HCT 48.8 03/24/2022   MCV 89.7 03/24/2022   PLT 176 03/24/2022   CTA chest IMPRESSION: 1. No CT evidence for acute pulmonary embolus. 2. Aortic Atherosclerosis (ICD10-I70.0) and Emphysema (ICD10-J43.9).  EKG: sinus tach, no st elevation  Assessment and Plan: COPD exacerbation Acute hypoxic respiratory failure     - admit to inpt, tele     - CTA negative     - COVID/flu negative; check RVP     - nebs, steroids, guaifenesin     - he's had multiple visits to outpt pulm for the same over the last 6 weeks; I've spoken with pulm, they will follow; appreciate their assistance  Tobacco abuse (chew)     - counsel against further use  GERD     - PPI  HLD     - statin  HTN     - resume home regimen  Hyperglycemia     - check A1c  Advance Care Planning:  Code Status: FULL  Consults: None  Family Communication: w/ wife at bedside  Severity of Illness: The appropriate patient status for this patient is INPATIENT. Inpatient status is judged to be reasonable and necessary in order to provide the required intensity of service to ensure the patient's safety. The patient's presenting symptoms, physical exam findings, and initial radiographic and laboratory data in the context of their chronic  comorbidities is felt to place them at high risk for further clinical deterioration. Furthermore, it is not anticipated that the patient will be medically stable for discharge from the hospital within 2 midnights of admission.   * I certify that at the point of admission it is my clinical judgment that the patient will require inpatient hospital care spanning beyond 2 midnights from the point of admission due to high intensity of service, high risk for further deterioration and high frequency of surveillance required.*  Author: 05/24/2022, DO 03/24/2022 8:36 AM  For on call review [email or web address].

## 2022-03-24 NOTE — ED Provider Notes (Signed)
[facility]-EMERGENCY DEPT Provider Note  CSN: [id number] Arrival date & time: 03/24/22 0451  Chief Complaint(s) Shortness of Breath  HPI Dale Sagar. is a 65 y.o. male with PMH COPD, GERD, HTN who presents emergency department for evaluation of shortness of breath.  Since 02/05/2022, patient has struggled with recurrent COPD exacerbations and associated pneumonia which she is required multiple steroid courses and completion of antibiotic therapy.  He states that tonight, his shortness of breath has gotten out of control and he arrives on a breathing treatment, tachypneic with accessory muscle use.  Patient received 4 albuterol treatments with no Atrovent prior to arrival.  Denies chest pain, abdominal pain, nausea, vomiting or other systemic symptoms.   Past Medical History Past Medical History:  Diagnosis Date   COPD (chronic obstructive pulmonary disease) with emphysema (HCC)    Esophageal reflux    Exertional shortness of breath    "just today" (09/27/2013)   Hypertension    Patient Active Problem List   Diagnosis Date Noted   Influenza A 06/23/2021   Former cigarette smoker 10/02/2020   Obesity (BMI 30-39.9) 12/27/2016   Tachycardia 09/27/2013   GERD (gastroesophageal reflux disease) 09/27/2013   Essential hypertension 09/27/2013   COPD exacerbation (HCC) 09/26/2013   COPD GOLD II  10/25/2007   Home Medication(s) Prior to Admission medications   Medication Sig Start Date End Date Taking? Authorizing Provider  albuterol (PROAIR HFA) 108 (90 Base) MCG/ACT inhaler 2 puffs every 4 hours as needed only  if your can't catch your breath 10/15/21   Nyoka Cowden, MD  albuterol (PROVENTIL) (2.5 MG/3ML) 0.083% nebulizer solution Take 3 mLs (2.5 mg total) by nebulization every 6 (six) hours as needed for wheezing or shortness of breath. 03/17/22   Bevelyn Ngo, NP  aspirin 81 MG tablet Take 81 mg by mouth daily.    [provider]  BREZTRI AEROSPHERE  160-9-4.8 MCG/ACT AERO INHALE 2 PUFFS INTO THE LUNGS 2 TIMES DAILY 10/08/21   Nyoka Cowden, MD  Cholecalciferol (VITAMIN D-3) 5000 units TABS Take 1 tablet by mouth daily.    [provider]  fish oil-omega-3 fatty acids 1000 MG capsule Take 1 g by mouth daily.    [provider]  irbesartan (AVAPRO) 150 MG tablet Take 1 tablet (150 mg total) by mouth daily. 12/25/16   Nyoka Cowden, MD  Multiple Vitamin (MULTIVITAMIN WITH MINERALS) TABS tablet Take 1 tablet by mouth daily.    [provider]  predniSONE (DELTASONE) 10 MG tablet 4 x 2 days, 2 x 2 days, 1 x 2 days, then stop 03/03/22   Nyoka Cowden, MD  predniSONE (DELTASONE) 10 MG tablet Prednisone taper; 10 mg tablets: 4 tabs x 2 days, 3 tabs x 2 days, 2 tabs x 2 days 1 tab x 2 days then stop. 03/13/22   Bevelyn Ngo, NP  Turmeric 500 MG CAPS Take 1 tablet by mouth daily.    [provider]  Past Surgical History Past Surgical History:  Procedure Laterality Date   INGUINAL HERNIA REPAIR Left 2000's   Family History History reviewed. No pertinent family history.  Social History Social History   Tobacco Use   Smoking status: Former    Packs/day: 1.00    Years: 30.00    Total pack years: 30.00    Types: Cigarettes    Quit date: 05/18/2007    Years since quitting: 14.8   Smokeless tobacco: Never  Substance Use Topics   Alcohol use: Yes    Alcohol/week: 6.0 standard drinks of alcohol    Types: 6 Cans of beer per week    Comment: 09/27/2013 "maybe a 6 pack of beer on the weekends"   Drug use: No   Allergies Patient has no known allergies.  Review of Systems Review of Systems  Respiratory:  Positive for cough, shortness of breath and wheezing.     Physical Exam Vital Signs  I have reviewed the triage vital signs BP (!) 203/110 (BP Location: Right Arm)    Pulse (!) 118   Temp 98.9 F (37.2 C) (Axillary)   Resp (!) 22   Ht 5\' 8"  (1.727 m)   Wt 104.3 kg   SpO2 94%   BMI 34.97 kg/m   Physical Exam Constitutional:      General: He is in acute distress.     Appearance: Normal appearance. He is ill-appearing.  HENT:     Head: Normocephalic and atraumatic.     Nose: No congestion or rhinorrhea.  Eyes:     General:        Right eye: No discharge.        Left eye: No discharge.     Extraocular Movements: Extraocular movements intact.     Pupils: Pupils are equal, round, and reactive to light.  Cardiovascular:     Rate and Rhythm: Normal rate and regular rhythm.     Heart sounds: No murmur heard. Pulmonary:     Effort: Tachypnea, accessory muscle usage and respiratory distress present.     Breath sounds: Wheezing present. No rales.  Abdominal:     General: There is no distension.     Tenderness: There is no abdominal tenderness.  Musculoskeletal:        General: Normal range of motion.     Cervical back: Normal range of motion.  Skin:    General: Skin is warm and dry.  Neurological:     General: No focal deficit present.     Mental Status: He is alert.     ED Results and Treatments Labs (all labs ordered are listed, but only abnormal results are displayed) Labs Reviewed  RESP PANEL BY RT-PCR (FLU A&B, COVID) ARPGX2  COMPREHENSIVE METABOLIC PANEL  CBC WITH DIFFERENTIAL/PLATELET  BRAIN NATRIURETIC PEPTIDE  BLOOD GAS, VENOUS  TROPONIN I (HIGH SENSITIVITY)  Radiology No results found.  Pertinent labs & imaging results that were available during my care of the patient were reviewed by me and considered in my medical decision making (see MDM for details).  Medications Ordered in ED Medications  methylPREDNISolone sodium succinate (SOLU-MEDROL) 125 mg/2 mL injection 125 mg (has no administration in time  range)  ipratropium-albuterol (DUONEB) 0.5-2.5 (3) MG/3ML nebulizer solution 9 mL (9 mLs Nebulization Given 03/24/22 0515)                                                                                                                                     Procedures .Critical Care  Performed by: Glendora Score, MD Authorized by: Glendora Score, MD   Critical care provider statement:    Critical care time (minutes):  30   Critical care was necessary to treat or prevent imminent or life-threatening deterioration of the following conditions:  Respiratory failure   Critical care was time spent personally by me on the following activities:  Development of treatment plan with patient or surrogate, discussions with consultants, evaluation of patient's response to treatment, examination of patient, ordering and review of laboratory studies, ordering and review of radiographic studies, ordering and performing treatments and interventions, pulse oximetry, re-evaluation of patient's condition and review of old charts   (including critical care time)  Medical Decision Making / ED Course   This patient presents to the ED for concern of shortness of breath, this involves an extensive number of treatment options, and is a complaint that carries with it a high risk of complications and morbidity.  The differential diagnosis includes COPD exacerbation, CHF exacerbation, PE, pneumonia  MDM: Patient seen emergency room for evaluation of shortness of breath.  Physical exam reveals patient in active respiratory distress with significant bilateral expiratory wheezing and accessory muscle use.  Laboratory evaluation with leukocytosis to 13.5, blood gas with a pH of 7.33 but no significant hypercarbia, chest x-ray unremarkable.  Patient placed on BiPAP and given DuoNebs and steroids and on reevaluation he is improving.  At time of signout, patient pending CT PE.  Please see provider signout for continuation of  work-up.   Additional history obtained: -Additional history obtained from wife -External records from outside source obtained and reviewed including: Chart review including previous notes, labs, imaging, consultation notes   Lab Tests: -I ordered, reviewed, and interpreted labs.   The pertinent results include:   Labs Reviewed  RESP PANEL BY RT-PCR (FLU A&B, COVID) ARPGX2  COMPREHENSIVE METABOLIC PANEL  CBC WITH DIFFERENTIAL/PLATELET  BRAIN NATRIURETIC PEPTIDE  BLOOD GAS, VENOUS  TROPONIN I (HIGH SENSITIVITY)      EKG   EKG Interpretation  Date/Time:  Tuesday March 24 2022 05:18:45 EDT Ventricular Rate:  111 PR Interval:  150 QRS Duration: 94 QT Interval:  338 QTC Calculation: 460 R Axis:   72 Text Interpretation: Sinus tachycardia Confirmed by Charnee Turnipseed ([id number]) on 03/24/2022 5:47:10 AM  Imaging Studies ordered: I ordered imaging studies including CXR I independently visualized and interpreted imaging. I agree with the radiologist interpretation   Medicines ordered and prescription drug management: Meds ordered this encounter  Medications   ipratropium-albuterol (DUONEB) 0.5-2.5 (3) MG/3ML nebulizer solution 9 mL   methylPREDNISolone sodium succinate (SOLU-MEDROL) 125 mg/2 mL injection 125 mg    IV methylprednisolone will be converted to either a q12h or q24h frequency with the same total daily dose (TDD).  Ordered Dose: 1 to 125 mg TDD; convert to: TDD q24h.  Ordered Dose: 126 to 250 mg TDD; convert to: TDD div q12h.  Ordered Dose: >250 mg TDD; DAW.    -I have reviewed the patients home medicines and have made adjustments as needed  Critical interventions BiPAP, multiple DuoNebs, steroids   Cardiac Monitoring: The patient was maintained on a cardiac monitor.  I personally viewed and interpreted the cardiac monitored which showed an underlying rhythm of: NSR  Social Determinants of Health:  Factors impacting patients care include:  none   Reevaluation: After the interventions noted above, I reevaluated the patient and found that they have :improved  Co morbidities that complicate the patient evaluation  Past Medical History:  Diagnosis Date   COPD (chronic obstructive pulmonary disease) with emphysema (HCC)    Esophageal reflux    Exertional shortness of breath    "just today" (09/27/2013)   Hypertension       Dispostion: I considered admission for this patient, the patient will ultimately require hospital admission but is pending CT PE.  Please see provider signout for continuation of care.     Final Clinical Impression(s) / ED Diagnoses Final diagnoses:  None     @PCDICTATION @    , MD 03/31/22 [phone]

## 2022-03-24 NOTE — ED Triage Notes (Signed)
Complaining of shortness of breath that started middle of last month, tonight it got worse.  Has had 3 rounds of prednisone. Now is using accessory muscles to breath

## 2022-03-25 ENCOUNTER — Telehealth: Payer: Self-pay | Admitting: Critical Care Medicine

## 2022-03-25 DIAGNOSIS — J441 Chronic obstructive pulmonary disease with (acute) exacerbation: Secondary | ICD-10-CM | POA: Diagnosis not present

## 2022-03-25 DIAGNOSIS — J449 Chronic obstructive pulmonary disease, unspecified: Secondary | ICD-10-CM | POA: Diagnosis not present

## 2022-03-25 DIAGNOSIS — J9601 Acute respiratory failure with hypoxia: Secondary | ICD-10-CM | POA: Diagnosis not present

## 2022-03-25 LAB — COMPREHENSIVE METABOLIC PANEL
ALT: 27 U/L (ref 0–44)
AST: 17 U/L (ref 15–41)
Albumin: 3.8 g/dL (ref 3.5–5.0)
Alkaline Phosphatase: 48 U/L (ref 38–126)
Anion gap: 10 (ref 5–15)
BUN: 24 mg/dL — ABNORMAL HIGH (ref 8–23)
CO2: 24 mmol/L (ref 22–32)
Calcium: 9 mg/dL (ref 8.9–10.3)
Chloride: 106 mmol/L (ref 98–111)
Creatinine, Ser: 0.89 mg/dL (ref 0.61–1.24)
GFR, Estimated: >60 mL/min (ref >60)
Glucose, Bld: 147 mg/dL — ABNORMAL HIGH (ref 70–99)
Potassium: 4.4 mmol/L (ref 3.5–5.1)
Sodium: 140 mmol/L (ref 135–145)
Total Bilirubin: 0.6 mg/dL (ref 0.3–1.2)
Total Protein: 7.1 g/dL (ref 6.5–8.1)

## 2022-03-25 LAB — CBC
HCT: 45.2 % (ref 39.0–52.0)
Hemoglobin: 14.8 g/dL (ref 13.0–17.0)
MCH: 29.7 pg (ref 26.0–34.0)
MCHC: 32.7 g/dL (ref 30.0–36.0)
MCV: 90.8 fL (ref 80.0–100.0)
Platelets: 204 10*3/uL (ref 150–400)
RBC: 4.98 MIL/uL (ref 4.22–5.81)
RDW: 13.9 % (ref 11.5–15.5)
WBC: 14.7 10*3/uL — ABNORMAL HIGH (ref 4.0–10.5)
nRBC: 0 % (ref 0.0–0.2)

## 2022-03-25 MED ORDER — METHYLPREDNISOLONE SODIUM SUCC 125 MG IJ SOLR
80.0000 mg | Freq: Every day | INTRAMUSCULAR | Status: AC
  Administered 2022-03-26: 80 mg via INTRAVENOUS
  Filled 2022-03-25: qty 2

## 2022-03-25 MED ORDER — PREDNISONE 20 MG PO TABS: 60.0000 mg | ORAL_TABLET | Freq: Every day | ORAL | Status: DC

## 2022-03-25 NOTE — Consult Note (Signed)
   NAME:  Dale Parton., MRN:  [id number], DOB:  Jul 24, 1956, LOS: 1 ADMISSION DATE:  03/24/2022, CONSULTATION DATE:  03/24/2022 REFERRING MD:  Dr. , CHIEF COMPLAINT: Pulm consult  History of Present Illness:  [name]. Is a 66 yo male with a past medical history significant for COPD Gold 2, former smoker, HTN, HLD, and GERD who presented to the emergency department 8/8 with complaints of shortness of breath.  Patient was admitted per [facility] service for treatment of COPD exasperation acute hypoxic respiratory failure.  Of note patient has been seen in the pulmonary clinic on multiple visits for similar presentation.  Patient was initially seen in clinic 02/05/2022 for acute COPD exacerbation at which time a prednisone taper and antibiotics were initiated.  BNP was slightly elevated and chest x-ray revealed persistent right middle lobe opacification.  Reports he felt much better with antibiotics and steroids but once steroid taper completed symptoms returned.  Seen again 7/21 where prednisone taper was extended and patient reported improvement in symptoms.  Patient had return visit 03/17/2022 and reported improvement in symptoms on prednisone taper.  Unfortunately symptoms returned and patient presented to ED 8/8.  Pertinent  Medical History  COPD Gold 2, former smoker, HTN, HLD, and GERD   Significant Hospital Events: Including procedures, antibiotic start and stop dates in addition to other pertinent events   8/8 presented with recurrent shortness of breath, admitted per [facility] for acute COPD exasperation and acute hypoxic respiratory failure.Marland Kitchen  PCCM consulted 8/9 No acute issues overnight, has not required BIPAP since ED   Interim History / Subjective:  States he feel much improved this am   Objective   Blood pressure 113/74, pulse 94, temperature 97.8 F (36.6 C), temperature source Oral, resp. rate 18, height 5\' 8"  (1.727 m), weight 104.3 kg, SpO2 92 %.       No intake or output data in  the 24 hours ending 03/25/22 0835 Filed Weights   03/24/22 0505  Weight: 104.3 kg    Examination: General: Well appearing middle aged male sitting up in bedside chair, in NAD HEENT: [place]/AT, MM pink/moist, PERRL,  Neuro: Alert and oriented x3, non-focal  CV: s1s2 regular rate and rhythm, no murmur, rubs, or gallops,  PULM:  Very faint expiratory wheeze, on 2 L [place], no increased work of breathing GI: soft, bowel sounds active in all 4 quadrants, non-tender, non-distended, tolerating oral diet Extremities: warm/dry, no edema  Skin: no rashes or lesions  Resolved Hospital Problem list     Assessment & Plan:  Persistent COPD exasperation -CTA chest with centrilobular and paraseptal emphysema evident. No focal airspace consolidation. No pleural effusion or suspicious pulmonary nodule or mass -Appears that patient COPD may be steroid dependent  -Utilizes Breztri at baseline  -May have allergic phenotype with asthma/COPD overlap Acute hypoxic respiratory failure -Initially required BiPAP therapy on admission but has since been transitioned to 3 L nasal cannula P: Wean supplemental oxygen with goal of RA SPO2 goal > 88% Please ambulate to determine need of home oxygen  Change Duonebs to Yupelri/Brovanan/Pulmicort Continue steroids with extended taper May be candidate for clinical trial for asthma biologic  Will need close pulmonary follow up   Best Practice (right click and "Reselect all SmartList Selections" daily)  Per primary  Critical care time: NA  Heber Hoog D. 05/24/22, NP-C [facility] Personal contact information can be found on Amion  03/25/2022, 8:35 AM

## 2022-03-25 NOTE — Progress Notes (Signed)
Pt seen and given scheduled nebulizer treatment which he tolerated well.  HR66, rr18, spo2 94% on 2l [place].  No increased wob/respiratory distress noted or voiced by patient.  Bipap not indicated at this time.

## 2022-03-25 NOTE — Progress Notes (Signed)
PROGRESS NOTE  [name].  NWG:[id number] DOB: [birth year] DOA: 03/24/2022 PCP: Joycelyn Rua, MD   Brief Narrative:  Patient is a 66 year old male with history of hypertension, hyperlipidemia, GERD, COPD who presented with shortness of breath.  He was recently treated for pneumonia with antibiotics and steroids.  When he finished the steroids, his symptoms returned.  He was also complaining of increased cough, short of breath.  Patient was admitted for the management of COPD exacerbation.  PCCM also consulted.   Assessment & Plan:  Principal Problem:   COPD exacerbation (HCC) Active Problems:   COPD with acute exacerbation (HCC)   GERD (gastroesophageal reflux disease)   Essential hypertension   Acute respiratory failure with hypoxia (HCC)   HLD (hyperlipidemia)   Hyperglycemia   Tobacco abuse  Acute COPD exacerbation: Presented with shortness of breath, increased cough.  Recently treated for pneumonia.  Has several ED visits for the same.  PCCM consulted.  Started on IV steroids. CT negative for pneumonia.  COVID/flu negative.  RVP negative. Continue nebulizing treatment, Mucinex. He needs to continue follow-up with PCCM as an outpatient. Currently on 2 L of oxygen per minute.  He is not on oxygen at home.  Will try to wean the oxygen.  This time he needs to be on slow taper of prednisone  Tobacco use: Counseled for cessation  GERD: Continue PPI  Hyperlipidemia: Continue statin  Hypertension: Continue home regimen  Leukocytosis: Most likely from steroids.  Continue monitor  Hyperglycemia: Likely from steroids.  A1c of 5.4.Monitor  Obesity: BMI of 34.9         DVT prophylaxis:enoxaparin (LOVENOX) injection 40 mg Start: 03/24/22 2200     Code Status: Full Code  Family Communication: None at bedside  Patient status:Inpatient  Patient is from :Home  Anticipated discharge VH:QION  Estimated DC date:1-2 days   Consultants:  None  Procedures:None  Antimicrobials:  Anti-infectives (From admission, onward)    None       Subjective: Patient seen and examined at the bedside this morning.  Sitting on the chair.  Appears comfortable.  Speaking in full sentences.  Became short of breath while going to the bathroom.  Denies any worsening cough.  On 2 L of oxygen.  Objective: Vitals:   03/24/22 2338 03/25/22 0340 03/25/22 0501 03/25/22 0739  BP:   113/74   Pulse:   94   Resp:   18   Temp:   97.8 F (36.6 C)   TempSrc:   Oral   SpO2: 93% 93% 94% 92%  Weight:      Height:       No intake or output data in the 24 hours ending 03/25/22 1007 Filed Weights   03/24/22 0505  Weight: 104.3 kg    Examination:  General exam: Overall comfortable, not in distress, obese HEENT: PERRL Respiratory system: Diminished sounds bilaterally, no wheezes or crackles  Cardiovascular system: S1 & S2 heard, RRR.  Gastrointestinal system: Abdomen is nondistended, soft and nontender. Central nervous system: Alert and oriented Extremities: No edema, no clubbing ,no cyanosis Skin: No rashes, no ulcers,no icterus     Data Reviewed: I have personally reviewed following labs and imaging studies  CBC: Recent Labs  Lab 03/24/22 0510 03/24/22 1204 03/25/22 0514  WBC 10.3 13.5* 14.7*  NEUTROABS 6.6  --   --   HGB 16.4 15.6 14.8  HCT 48.8 46.7 45.2  MCV 89.7 89.6 90.8  PLT 176 195 204   Basic Metabolic Panel: Recent  Labs  Lab 03/24/22 0510 03/24/22 1204 03/25/22 0514  NA 139  --  140  K 3.8  --  4.4  CL 107  --  106  CO2 23  --  24  GLUCOSE 153*  --  147*  BUN 18  --  24*  CREATININE 0.96 1.17 0.89  CALCIUM 9.1  --  9.0     Recent Results (from the past 240 hour(s))  Resp Panel by RT-PCR (Flu A&B, Covid) Anterior Nasal Swab     Status: None   Collection Time: 03/24/22  5:21 AM   Specimen: Anterior Nasal Swab  Result Value Ref Range Status   SARS Coronavirus 2 by RT PCR NEGATIVE NEGATIVE Final     Comment: (NOTE) SARS-CoV-2 target nucleic acids are NOT DETECTED.  The SARS-CoV-2 RNA is generally detectable in upper respiratory specimens during the acute phase of infection. The lowest concentration of SARS-CoV-2 viral copies this assay can detect is 138 copies/mL. A negative result does not preclude SARS-Cov-2 infection and should not be used as the sole basis for treatment or other patient management decisions. A negative result may occur with  improper specimen collection/handling, submission of specimen other than nasopharyngeal swab, presence of viral mutation(s) within the areas targeted by this assay, and inadequate number of viral copies(<138 copies/mL). A negative result must be combined with clinical observations, patient history, and epidemiological information. The expected result is Negative.  Fact Sheet for Patients:  BloggerCourse.com  Fact Sheet for Healthcare Providers:  SeriousBroker.it  This test is no t yet approved or cleared by the [place] FDA and  has been authorized for detection and/or diagnosis of SARS-CoV-2 by FDA under an Emergency Use Authorization (EUA). This EUA will remain  in effect (meaning this test can be used) for the duration of the COVID-19 declaration under Section 564(b)(1) of the Act, 21 U.S.C.section 360bbb-3(b)(1), unless the authorization is terminated  or revoked sooner.       Influenza A by PCR NEGATIVE NEGATIVE Final   Influenza B by PCR NEGATIVE NEGATIVE Final    Comment: (NOTE) The Xpert Xpress SARS-CoV-2/FLU/RSV plus assay is intended as an aid in the diagnosis of influenza from Nasopharyngeal swab specimens and should not be used as a sole basis for treatment. Nasal washings and aspirates are unacceptable for Xpert Xpress SARS-CoV-2/FLU/RSV testing.  Fact Sheet for Patients: BloggerCourse.com  Fact Sheet for Healthcare  Providers: SeriousBroker.it  This test is not yet approved or cleared by the [place] FDA and has been authorized for detection and/or diagnosis of SARS-CoV-2 by FDA under an Emergency Use Authorization (EUA). This EUA will remain in effect (meaning this test can be used) for the duration of the COVID-19 declaration under Section 564(b)(1) of the Act, 21 U.S.C. section 360bbb-3(b)(1), unless the authorization is terminated or revoked.  Performed at [facility], [address], [place], [place] [address]   Respiratory (~20 pathogens) panel by PCR     Status: None   Collection Time: 03/24/22  9:08 AM   Specimen: Nasopharyngeal Swab; Respiratory  Result Value Ref Range Status   Adenovirus NOT DETECTED NOT DETECTED Final   Coronavirus 229E NOT DETECTED NOT DETECTED Final    Comment: (NOTE) The Coronavirus on the Respiratory Panel, DOES NOT test for the novel  Coronavirus (2019 nCoV)    Coronavirus HKU1 NOT DETECTED NOT DETECTED Final   Coronavirus NL63 NOT DETECTED NOT DETECTED Final   Coronavirus OC43 NOT DETECTED NOT DETECTED Final   Metapneumovirus NOT  DETECTED NOT DETECTED Final   Rhinovirus / Enterovirus NOT DETECTED NOT DETECTED Final   Influenza A NOT DETECTED NOT DETECTED Final   Influenza B NOT DETECTED NOT DETECTED Final   Parainfluenza Virus 1 NOT DETECTED NOT DETECTED Final   Parainfluenza Virus 2 NOT DETECTED NOT DETECTED Final   Parainfluenza Virus 3 NOT DETECTED NOT DETECTED Final   Parainfluenza Virus 4 NOT DETECTED NOT DETECTED Final   Respiratory Syncytial Virus NOT DETECTED NOT DETECTED Final   Bordetella pertussis NOT DETECTED NOT DETECTED Final   Bordetella Parapertussis NOT DETECTED NOT DETECTED Final   Chlamydophila pneumoniae NOT DETECTED NOT DETECTED Final   Mycoplasma pneumoniae NOT DETECTED NOT DETECTED Final    Comment: Performed at [facility], [address], [place], [place] [address]      Radiology Studies: CT Angio Chest PE W and/or Wo Contrast  Result Date: 03/24/2022 CLINICAL DATA:  Shortness of breath for 1 month. Worsening over night. EXAM: CT ANGIOGRAPHY CHEST WITH CONTRAST TECHNIQUE: Multidetector CT imaging of the chest was performed using the standard protocol during bolus administration of intravenous contrast. Multiplanar CT image reconstructions and MIPs were obtained to evaluate the vascular anatomy. RADIATION DOSE REDUCTION: This exam was performed according to the departmental dose-optimization program which includes automated exposure control, adjustment of the mA and/or kV according to patient size and/or use of iterative reconstruction technique. CONTRAST:  OMNIPAQUE IOHEXOL 350 MG/ML SOLN COMPARISON:  Standard CT chest 11/24/2007. FINDINGS: Cardiovascular: The heart size is normal. No substantial pericardial effusion. Coronary artery calcification is evident. Mild atherosclerotic calcification is noted in the wall of the thoracic aorta. There is no filling defect within the opacified pulmonary arteries to suggest the presence of an acute pulmonary embolus. Mediastinum/Nodes: No mediastinal lymphadenopathy. There is no hilar lymphadenopathy. The esophagus has normal imaging features. There is no axillary lymphadenopathy. Lungs/Pleura: Centrilobular and paraseptal emphysema evident. No focal airspace consolidation. No pleural effusion. No suspicious pulmonary nodule or mass. Upper Abdomen: Unremarkable. Musculoskeletal: No worrisome lytic or sclerotic osseous abnormality. Review of the MIP images confirms the above findings. IMPRESSION: 1. No CT evidence for acute pulmonary embolus. 2. Aortic Atherosclerosis (ICD10-I70.0) and Emphysema (ICD10-J43.9). Electronically Signed   By: Kennith Center M.D.   On: 03/24/2022 07:22   DG Chest Portable 1 View  Result Date: 03/24/2022 CLINICAL DATA:  Dyspnea and shortness of breath. EXAM: PORTABLE CHEST 1 VIEW COMPARISON:  Chest PA  Lat 02/05/2022 FINDINGS: The heart size and mediastinal contours are within normal limits. Both lungs are clear. The visualized skeletal structures are unremarkable. There are multiple overlying monitor wires. IMPRESSION: No evidence of acute chest disease.  Stable chest. Electronically Signed   By: Almira Bar M.D.   On: 03/24/2022 05:27    Scheduled Meds:  arformoterol  15 mcg Nebulization BID   aspirin  81 mg Oral Daily   budesonide (PULMICORT) nebulizer solution  0.25 mg Nebulization BID   cholecalciferol  1,000 Units Oral Daily   enoxaparin (LOVENOX) injection  40 mg Subcutaneous QHS   guaiFENesin  1,200 mg Oral BID   ipratropium-albuterol  3 mL Nebulization Q4H   irbesartan  150 mg Oral Daily   methylPREDNISolone (SOLU-MEDROL) injection  80 mg Intravenous Daily   Followed by   Melene Muller ON 03/27/2022] predniSONE  40 mg Oral Q breakfast   multivitamin with minerals  1 tablet Oral Daily   omega-3 acid ethyl esters  1 g Oral Daily   pantoprazole  40 mg Oral Daily  revefenacin  175 mcg Nebulization Daily   Continuous Infusions:   LOS: 1 day   Burnadette Pop, MD [facility] P8/04/2022, 10:07 AM

## 2022-03-25 NOTE — Telephone Encounter (Signed)
Hospital follow up appointment has been requested in 2 weeks.  Steffanie Dunn, DO 03/25/22 11:21 AM [facility]

## 2022-03-25 NOTE — Telephone Encounter (Signed)
Patient scheduled for HFU on 04/08/2022 at 11am with Rubye Oaks, NP. Reminder mailed to address on file- nothing further needed.

## 2022-03-26 ENCOUNTER — Ambulatory Visit (HOSPITAL_COMMUNITY): Payer: 59

## 2022-03-26 ENCOUNTER — Encounter (HOSPITAL_COMMUNITY): Payer: Self-pay

## 2022-03-26 ENCOUNTER — Other Ambulatory Visit (HOSPITAL_COMMUNITY): Payer: 59

## 2022-03-26 DIAGNOSIS — J441 Chronic obstructive pulmonary disease with (acute) exacerbation: Secondary | ICD-10-CM | POA: Diagnosis not present

## 2022-03-26 LAB — CBC
HCT: 45.6 % (ref 39.0–52.0)
Hemoglobin: 15.1 g/dL (ref 13.0–17.0)
MCH: 30.1 pg (ref 26.0–34.0)
MCHC: 33.1 g/dL (ref 30.0–36.0)
MCV: 91 fL (ref 80.0–100.0)
Platelets: 193 10*3/uL (ref 150–400)
RBC: 5.01 MIL/uL (ref 4.22–5.81)
RDW: 14 % (ref 11.5–15.5)
WBC: 15.8 10*3/uL — ABNORMAL HIGH (ref 4.0–10.5)
nRBC: 0 % (ref 0.0–0.2)

## 2022-03-26 MED ORDER — PREDNISONE 10 MG PO TABS: 10.0000 mg | ORAL_TABLET | Freq: Every day | ORAL | 0 refills | Status: DC

## 2022-03-26 MED ORDER — GUAIFENESIN ER 600 MG PO TB12: 1200.0000 mg | ORAL_TABLET | Freq: Two times a day (BID) | ORAL | 0 refills | Status: AC

## 2022-03-26 MED ORDER — GUAIFENESIN ER 600 MG PO TB12: 1200.0000 mg | ORAL_TABLET | Freq: Two times a day (BID) | ORAL | 0 refills | Status: DC

## 2022-03-26 NOTE — Discharge Summary (Addendum)
Physician Discharge Summary  [name]. CBS:[id number] DOB: September 26, 1955 DOA: 03/24/2022  PCP: Joycelyn Rua, MD  Admit date: 03/24/2022 Discharge date: 03/26/2022  Admitted From: Home Disposition:  Home  Discharge Condition:Stable CODE STATUS:FULL Diet recommendation: Heart Healthy   Brief/Interim Summary:  Patient is a 66 year old male with history of hypertension, hyperlipidemia, GERD, COPD who presented with shortness of breath.  He was recently treated for pneumonia with antibiotics and steroids.  When he finished the steroids, his symptoms returned.  He was also complaining of increased cough, short of breath.  Patient was admitted for the management of COPD exacerbation.  PCCM also consulted.  Patient was started on IV steroids.  Patient's respiratory status continued to improve, currently he is free of wheezing and is saturating fine on room air today.  He is medically stable for discharge to home with tapering dose of prednisone.  He will follow-up with pulmonology as an outpatient.  Following problems were addressed during his hospitalization:  Acute COPD exacerbation: Presented with shortness of breath, increased cough.  Recently treated for pneumonia.  Has several ED visits for the same.  PCCM consulted.  Started on IV steroids. CT negative for pneumonia.  COVID/flu negative.  RVP negative. Treated with nebulizing treatment, Mucinex. He needs to continue follow-up with PCCM as an outpatient. He was weaned off oxygen this morning.  PCCM cleared for discharge.  He will be called for appointment.    GERD: Continue PPI   Hyperlipidemia: Continue statin   Hypertension: Continue home regimen   Leukocytosis: Most likely from steroids. Follow up with PCP in a week.   Hyperglycemia: Likely from steroids.  A1c of 5.4.   Obesity: BMI of 34.9    Discharge Diagnoses:  Principal Problem:   COPD exacerbation (HCC) Active Problems:   COPD with acute exacerbation (HCC)    GERD (gastroesophageal reflux disease)   Essential hypertension   Acute respiratory failure with hypoxia (HCC)   HLD (hyperlipidemia)   Hyperglycemia   Tobacco abuse    Discharge Instructions  Discharge Instructions     Diet - low sodium heart healthy   Complete by: As directed    Discharge instructions   Complete by: As directed    1)Please take prescribed medications as instructed 2)Follow up with your PCP in a week 3)Follow up with pulmonology.  You will be called for follow-up appointment.   Increase activity slowly   Complete by: As directed       Allergies as of 03/26/2022       Reactions   Hydrochlorothiazide Other (See Comments)        Medication List     TAKE these medications    albuterol 108 (90 Base) MCG/ACT inhaler Commonly known as: ProAir HFA 2 puffs every 4 hours as needed only  if your can't catch your breath   albuterol (2.5 MG/3ML) 0.083% nebulizer solution Commonly known as: PROVENTIL Take 3 mLs (2.5 mg total) by nebulization every 6 (six) hours as needed for wheezing or shortness of breath.   aspirin 81 MG tablet Take 81 mg by mouth daily.   Breztri Aerosphere 160-9-4.8 MCG/ACT Aero Generic drug: Budeson-Glycopyrrol-Formoterol INHALE 2 PUFFS INTO THE LUNGS 2 TIMES DAILY   fish oil-omega-3 fatty acids 1000 MG capsule Take 1 g by mouth daily.   guaiFENesin 600 MG 12 hr tablet Commonly known as: MUCINEX Take 2 tablets (1,200 mg total) by mouth 2 (two) times daily for 5 days.   irbesartan 150 MG tablet Commonly known as:  Avapro Take 1 tablet (150 mg total) by mouth daily.   multivitamin with minerals Tabs tablet Take 1 tablet by mouth daily.   pantoprazole 40 MG tablet Commonly known as: PROTONIX Take 40 mg by mouth daily.   predniSONE 10 MG tablet Commonly known as: DELTASONE Take 1 tablet (10 mg total) by mouth daily. Take 6 pills a day for 5 days then 4 pills a day for 3 days then 2 pills a day for 3 days then continue taking  1 pill a day   Turmeric 500 MG Caps Take 1 tablet by mouth daily.   Vitamin D-3 125 MCG (5000 UT) Tabs Take 1 tablet by mouth daily.        Follow-up Information     Joycelyn Rua, MD. Schedule an appointment as soon as possible for a visit in 1 week(s).   Specialty: Family Medicine Contact information: [address] [place] [address] [phone]                Allergies  Allergen Reactions   Hydrochlorothiazide Other (See Comments)    Consultations: Pulmonology   Procedures/Studies: CT Angio Chest PE W and/or Wo Contrast  Result Date: 03/24/2022 CLINICAL DATA:  Shortness of breath for 1 month. Worsening over night. EXAM: CT ANGIOGRAPHY CHEST WITH CONTRAST TECHNIQUE: Multidetector CT imaging of the chest was performed using the standard protocol during bolus administration of intravenous contrast. Multiplanar CT image reconstructions and MIPs were obtained to evaluate the vascular anatomy. RADIATION DOSE REDUCTION: This exam was performed according to the departmental dose-optimization program which includes automated exposure control, adjustment of the mA and/or kV according to patient size and/or use of iterative reconstruction technique. CONTRAST:  OMNIPAQUE IOHEXOL 350 MG/ML SOLN COMPARISON:  Standard CT chest 11/24/2007. FINDINGS: Cardiovascular: The heart size is normal. No substantial pericardial effusion. Coronary artery calcification is evident. Mild atherosclerotic calcification is noted in the wall of the thoracic aorta. There is no filling defect within the opacified pulmonary arteries to suggest the presence of an acute pulmonary embolus. Mediastinum/Nodes: No mediastinal lymphadenopathy. There is no hilar lymphadenopathy. The esophagus has normal imaging features. There is no axillary lymphadenopathy. Lungs/Pleura: Centrilobular and paraseptal emphysema evident. No focal airspace consolidation. No pleural effusion. No suspicious pulmonary  nodule or mass. Upper Abdomen: Unremarkable. Musculoskeletal: No worrisome lytic or sclerotic osseous abnormality. Review of the MIP images confirms the above findings. IMPRESSION: 1. No CT evidence for acute pulmonary embolus. 2. Aortic Atherosclerosis (ICD10-I70.0) and Emphysema (ICD10-J43.9). Electronically Signed   By: Kennith Center M.D.   On: 03/24/2022 07:22   DG Chest Portable 1 View  Result Date: 03/24/2022 CLINICAL DATA:  Dyspnea and shortness of breath. EXAM: PORTABLE CHEST 1 VIEW COMPARISON:  Chest PA Lat 02/05/2022 FINDINGS: The heart size and mediastinal contours are within normal limits. Both lungs are clear. The visualized skeletal structures are unremarkable. There are multiple overlying monitor wires. IMPRESSION: No evidence of acute chest disease.  Stable chest. Electronically Signed   By: Almira Bar M.D.   On: 03/24/2022 05:27      Subjective: Patient seen and examined at the bedside this morning.  Sitting on the chair.  Comfortable, speaking in full sentences.  On room air.  Wife at the bedside.  We discussed about discharge planning today.  Discharge Exam: Vitals:   03/26/22 0810 03/26/22 0811  BP:    Pulse:    Resp:    Temp:    SpO2: 96% 96%   Vitals:  03/26/22 0309 03/26/22 0605 03/26/22 0810 03/26/22 0811  BP:  (!) 147/85    Pulse:  83    Resp:  14    Temp:  97.6 F (36.4 C)    TempSrc:  Oral    SpO2: 93% 100% 96% 96%  Weight:      Height:        General: Pt is alert, awake, not in acute distress,obese Cardiovascular: RRR, S1/S2 +, no rubs, no gallops Respiratory: CTA bilaterally, no wheezing, no rhonchi Abdominal: Soft, NT, ND, bowel sounds + Extremities: no edema, no cyanosis    The results of significant diagnostics from this hospitalization (including imaging, microbiology, ancillary and laboratory) are listed below for reference.     Microbiology: Recent Results (from the past 240 hour(s))  Resp Panel by RT-PCR (Flu A&B, Covid) Anterior  Nasal Swab     Status: None   Collection Time: 03/24/22  5:21 AM   Specimen: Anterior Nasal Swab  Result Value Ref Range Status   SARS Coronavirus 2 by RT PCR NEGATIVE NEGATIVE Final    Comment: (NOTE) SARS-CoV-2 target nucleic acids are NOT DETECTED.  The SARS-CoV-2 RNA is generally detectable in upper respiratory specimens during the acute phase of infection. The lowest concentration of SARS-CoV-2 viral copies this assay can detect is 138 copies/mL. A negative result does not preclude SARS-Cov-2 infection and should not be used as the sole basis for treatment or other patient management decisions. A negative result may occur with  improper specimen collection/handling, submission of specimen other than nasopharyngeal swab, presence of viral mutation(s) within the areas targeted by this assay, and inadequate number of viral copies(<138 copies/mL). A negative result must be combined with clinical observations, patient history, and epidemiological information. The expected result is Negative.  Fact Sheet for Patients:  BloggerCourse.comhttps://www.fda.gov/media/152166/download  Fact Sheet for Healthcare Providers:  SeriousBroker.ithttps://www.fda.gov/media/152162/download  This test is no t yet approved or cleared by the [place] FDA and  has been authorized for detection and/or diagnosis of SARS-CoV-2 by FDA under an Emergency Use Authorization (EUA). This EUA will remain  in effect (meaning this test can be used) for the duration of the COVID-19 declaration under Section 564(b)(1) of the Act, 21 U.S.C.section 360bbb-3(b)(1), unless the authorization is terminated  or revoked sooner.       Influenza A by PCR NEGATIVE NEGATIVE Final   Influenza B by PCR NEGATIVE NEGATIVE Final    Comment: (NOTE) The Xpert Xpress SARS-CoV-2/FLU/RSV plus assay is intended as an aid in the diagnosis of influenza from Nasopharyngeal swab specimens and should not be used as a sole basis for treatment. Nasal washings  and aspirates are unacceptable for Xpert Xpress SARS-CoV-2/FLU/RSV testing.  Fact Sheet for Patients: BloggerCourse.comhttps://www.fda.gov/media/152166/download  Fact Sheet for Healthcare Providers: SeriousBroker.ithttps://www.fda.gov/media/152162/download  This test is not yet approved or cleared by the [place] FDA and has been authorized for detection and/or diagnosis of SARS-CoV-2 by FDA under an Emergency Use Authorization (EUA). This EUA will remain in effect (meaning this test can be used) for the duration of the COVID-19 declaration under Section 564(b)(1) of the Act, 21 U.S.C. section 360bbb-3(b)(1), unless the authorization is terminated or revoked.  Performed at [facility], [address] [place], [place] [id number]   Respiratory (~20 pathogens) panel by PCR     Status: None   Collection Time: 03/24/22  9:08 AM   Specimen: Nasopharyngeal Swab; Respiratory  Result Value Ref Range Status   Adenovirus NOT DETECTED NOT DETECTED Final   Coronavirus  229E NOT DETECTED NOT DETECTED Final    Comment: (NOTE) The Coronavirus on the Respiratory Panel, DOES NOT test for the novel  Coronavirus (2019 nCoV)    Coronavirus HKU1 NOT DETECTED NOT DETECTED Final   Coronavirus NL63 NOT DETECTED NOT DETECTED Final   Coronavirus OC43 NOT DETECTED NOT DETECTED Final   Metapneumovirus NOT DETECTED NOT DETECTED Final   Rhinovirus / Enterovirus NOT DETECTED NOT DETECTED Final   Influenza A NOT DETECTED NOT DETECTED Final   Influenza B NOT DETECTED NOT DETECTED Final   Parainfluenza Virus 1 NOT DETECTED NOT DETECTED Final   Parainfluenza Virus 2 NOT DETECTED NOT DETECTED Final   Parainfluenza Virus 3 NOT DETECTED NOT DETECTED Final   Parainfluenza Virus 4 NOT DETECTED NOT DETECTED Final   Respiratory Syncytial Virus NOT DETECTED NOT DETECTED Final   Bordetella pertussis NOT DETECTED NOT DETECTED Final   Bordetella Parapertussis NOT DETECTED NOT DETECTED Final   Chlamydophila pneumoniae NOT  DETECTED NOT DETECTED Final   Mycoplasma pneumoniae NOT DETECTED NOT DETECTED Final    Comment: Performed at [facility], [address], [place], [place] [address]     Labs: BNP (last 3 results) Recent Labs    03/24/22 0510  BNP 16.5   Basic Metabolic Panel: Recent Labs  Lab 03/24/22 0510 03/24/22 1204 03/25/22 0514  NA 139  --  140  K 3.8  --  4.4  CL 107  --  106  CO2 23  --  24  GLUCOSE 153*  --  147*  BUN 18  --  24*  CREATININE 0.96 1.17 0.89  CALCIUM 9.1  --  9.0   Liver Function Tests: Recent Labs  Lab 03/24/22 0510 03/25/22 0514  AST 21 17  ALT 31 27  ALKPHOS 61 48  BILITOT 1.1 0.6  PROT 7.8 7.1  ALBUMIN 4.2 3.8   No results for input(s): "LIPASE", "AMYLASE" in the last 168 hours. No results for input(s): "AMMONIA" in the last 168 hours. CBC: Recent Labs  Lab 03/24/22 0510 03/24/22 1204 03/25/22 0514 03/26/22 0522  WBC 10.3 13.5* 14.7* 15.8*  NEUTROABS 6.6  --   --   --   HGB 16.4 15.6 14.8 15.1  HCT 48.8 46.7 45.2 45.6  MCV 89.7 89.6 90.8 91.0  PLT 176 195 204 193   Cardiac Enzymes: No results for input(s): "CKTOTAL", "CKMB", "CKMBINDEX", "TROPONINI" in the last 168 hours. BNP: Invalid input(s): "POCBNP" CBG: No results for input(s): "GLUCAP" in the last 168 hours. D-Dimer No results for input(s): "DDIMER" in the last 72 hours. Hgb A1c Recent Labs    03/24/22 1204  HGBA1C 5.4   Lipid Profile No results for input(s): "CHOL", "HDL", "LDLCALC", "TRIG", "CHOLHDL", "LDLDIRECT" in the last 72 hours. Thyroid function studies No results for input(s): "TSH", "T4TOTAL", "T3FREE", "THYROIDAB" in the last 72 hours.  Invalid input(s): "FREET3" Anemia work up No results for input(s): "VITAMINB12", "FOLATE", "FERRITIN", "TIBC", "IRON", "RETICCTPCT" in the last 72 hours. Urinalysis No results found for: "COLORURINE", "APPEARANCEUR", "LABSPEC", "PHURINE", "GLUCOSEU", "HGBUR", "BILIRUBINUR", "KETONESUR", "PROTEINUR", "UROBILINOGEN",  "NITRITE", "LEUKOCYTESUR" Sepsis Labs Recent Labs  Lab 03/24/22 0510 03/24/22 1204 03/25/22 0514 03/26/22 0522  WBC 10.3 13.5* 14.7* 15.8*   Microbiology Recent Results (from the past 240 hour(s))  Resp Panel by RT-PCR (Flu A&B, Covid) Anterior Nasal Swab     Status: None   Collection Time: 03/24/22  5:21 AM   Specimen: Anterior Nasal Swab  Result Value Ref Range Status   SARS Coronavirus 2  by RT PCR NEGATIVE NEGATIVE Final    Comment: (NOTE) SARS-CoV-2 target nucleic acids are NOT DETECTED.  The SARS-CoV-2 RNA is generally detectable in upper respiratory specimens during the acute phase of infection. The lowest concentration of SARS-CoV-2 viral copies this assay can detect is 138 copies/mL. A negative result does not preclude SARS-Cov-2 infection and should not be used as the sole basis for treatment or other patient management decisions. A negative result may occur with  improper specimen collection/handling, submission of specimen other than nasopharyngeal swab, presence of viral mutation(s) within the areas targeted by this assay, and inadequate number of viral copies(<138 copies/mL). A negative result must be combined with clinical observations, patient history, and epidemiological information. The expected result is Negative.  Fact Sheet for Patients:  BloggerCourse.com  Fact Sheet for Healthcare Providers:  SeriousBroker.it  This test is no t yet approved or cleared by the [place] FDA and  has been authorized for detection and/or diagnosis of SARS-CoV-2 by FDA under an Emergency Use Authorization (EUA). This EUA will remain  in effect (meaning this test can be used) for the duration of the COVID-19 declaration under Section 564(b)(1) of the Act, 21 U.S.C.section 360bbb-3(b)(1), unless the authorization is terminated  or revoked sooner.       Influenza A by PCR NEGATIVE NEGATIVE Final   Influenza B by PCR  NEGATIVE NEGATIVE Final    Comment: (NOTE) The Xpert Xpress SARS-CoV-2/FLU/RSV plus assay is intended as an aid in the diagnosis of influenza from Nasopharyngeal swab specimens and should not be used as a sole basis for treatment. Nasal washings and aspirates are unacceptable for Xpert Xpress SARS-CoV-2/FLU/RSV testing.  Fact Sheet for Patients: BloggerCourse.com  Fact Sheet for Healthcare Providers: SeriousBroker.it  This test is not yet approved or cleared by the [place] FDA and has been authorized for detection and/or diagnosis of SARS-CoV-2 by FDA under an Emergency Use Authorization (EUA). This EUA will remain in effect (meaning this test can be used) for the duration of the COVID-19 declaration under Section 564(b)(1) of the Act, 21 U.S.C. section 360bbb-3(b)(1), unless the authorization is terminated or revoked.  Performed at [facility], [address] [place], [place] [address]   Respiratory (~20 pathogens) panel by PCR     Status: None   Collection Time: 03/24/22  9:08 AM   Specimen: Nasopharyngeal Swab; Respiratory  Result Value Ref Range Status   Adenovirus NOT DETECTED NOT DETECTED Final   Coronavirus 229E NOT DETECTED NOT DETECTED Final    Comment: (NOTE) The Coronavirus on the Respiratory Panel, DOES NOT test for the novel  Coronavirus (2019 nCoV)    Coronavirus HKU1 NOT DETECTED NOT DETECTED Final   Coronavirus NL63 NOT DETECTED NOT DETECTED Final   Coronavirus OC43 NOT DETECTED NOT DETECTED Final   Metapneumovirus NOT DETECTED NOT DETECTED Final   Rhinovirus / Enterovirus NOT DETECTED NOT DETECTED Final   Influenza A NOT DETECTED NOT DETECTED Final   Influenza B NOT DETECTED NOT DETECTED Final   Parainfluenza Virus 1 NOT DETECTED NOT DETECTED Final   Parainfluenza Virus 2 NOT DETECTED NOT DETECTED Final   Parainfluenza Virus 3 NOT DETECTED NOT DETECTED Final   Parainfluenza Virus  4 NOT DETECTED NOT DETECTED Final   Respiratory Syncytial Virus NOT DETECTED NOT DETECTED Final   Bordetella pertussis NOT DETECTED NOT DETECTED Final   Bordetella Parapertussis NOT DETECTED NOT DETECTED Final   Chlamydophila pneumoniae NOT DETECTED NOT DETECTED Final   Mycoplasma pneumoniae NOT DETECTED NOT  DETECTED Final    Comment: Performed at [facility], [address], [place], [place] [address]    Please note: You were cared for by a hospitalist during your hospital stay. Once you are discharged, your primary care physician will handle any further medical issues. Please note that NO REFILLS for any discharge medications will be authorized once you are discharged, as it is imperative that you return to your primary care physician (or establish a relationship with a primary care physician if you do not have one) for your post hospital discharge needs so that they can reassess your need for medications and monitor your lab values.    Time coordinating discharge: 40 minutes  SIGNED:   Burnadette Pop, MD  [facility] 03/26/2022, 12:44 PM Pager [phone]  If 7PM-7AM, please contact night-coverage [email or web address] Password [email or web address]

## 2022-03-26 NOTE — TOC Progression Note (Signed)
Transition of Care (TOC) - Progression Note    Patient Details  Name: [name]. MRN: [id number] Date of Birth: 1955-09-09  Transition of Care [facility]) CM/SW Contact  Coralyn Helling, [place] Phone Number: 03/26/2022, 10:12 AM  Clinical Narrative:      Transition of Care [facility]) Screening Note   Patient Details  Name: [name]. Date of Birth: 1955/12/08   Transition of Care [facility]) CM/SW Contact:    Coralyn Helling, LCSW Phone Number: 03/26/2022, 10:12 AM    Transition of Care Department [facility]) has reviewed patient and no TOC needs have been identified at this time. We will continue to monitor patient advancement through interdisciplinary progression rounds. If new patient transition needs arise, please place a TOC consult.         Expected Discharge Plan and Services                                                 Social Determinants of Health (SDOH) Interventions    Readmission Risk Interventions     No data to display

## 2022-04-08 ENCOUNTER — Ambulatory Visit (INDEPENDENT_AMBULATORY_CARE_PROVIDER_SITE_OTHER): Payer: 59 | Admitting: Adult Health

## 2022-04-08 ENCOUNTER — Encounter: Payer: Self-pay | Admitting: Adult Health

## 2022-04-08 DIAGNOSIS — J441 Chronic obstructive pulmonary disease with (acute) exacerbation: Secondary | ICD-10-CM | POA: Diagnosis not present

## 2022-04-08 MED ORDER — MONTELUKAST SODIUM 10 MG PO TABS: 10.0000 mg | ORAL_TABLET | Freq: Every day | ORAL | 11 refills | Status: DC

## 2022-04-08 NOTE — Assessment & Plan Note (Signed)
Recurrent COPD exacerbations-may have a component of asthma overlap with allergic phenotype most recently hospitalized requiring additional steroids.  Hospital labs showed elevated eosinophils.  We will add in Singulair and Zyrtec. Continue on Breztri twice daily.  We will taper prednisone down slowly over the next 2 weeks.  Check PFTs on return visit.  If continues to have flares consider adding an biologic such as Dupixent  Plan  Patient Instructions  Taper Prednisone 10mg  daily for 1 week then 5mg  daily for 1 week and then stop.  Add Singulair 10mg  daily.  Zyrtec 10mg  daily As needed  drainage.  Continue on Breztri 2 puffs twice daily, rinse after use Mucinex DM twice daily as needed for cough and congestion Albuterol inhaler as needed Follow up with Dr. in 6 weeks with PFT and As needed   Please contact office for sooner follow up if symptoms do not improve or worsen or seek emergency care '

## 2022-04-08 NOTE — Patient Instructions (Addendum)
Taper Prednisone 10mg  daily for 1 week then 5mg  daily for 1 week and then stop.  Add Singulair 10mg  daily.  Zyrtec 10mg  daily As needed  drainage.  Continue on Breztri 2 puffs twice daily, rinse after use Mucinex DM twice daily as needed for cough and congestion Albuterol inhaler as needed Follow up with Dr. in 6 weeks with PFT and As needed   Please contact office for sooner follow up if symptoms do not improve or worsen or seek emergency care

## 2022-04-08 NOTE — Progress Notes (Signed)
@Patient  ID: ., male    DOB: 1955/09/18, 66 y.o.   MRN: [id number]  Chief Complaint  Patient presents with   Hospitalization Follow-up    Referring provider: [id number], MD  HPI: 66 year old male former smoker followed for moderate COPD  TEST/EVENTS :  CT chest March 24, 2022 negative for PE, positive emphysema PFT's 2008:  FEV1 1.08 (33%), ratio 43, ++BD response, +airtrapping, DLCO 78% Spirometry May 2018 FEV1 57%, ratio 60, FVC 64%   04/08/2022 Follow up : COPD, Post hospital follow up  Patient presents for a post hospital follow-up.  Patient was admitted earlier this month for an acute COPD exacerbation.  He underwent a CT chest that was negative for PE no acute process.  Positive emphysema.  COVID-19 and flu were negative.  Respiratory viral panel was negative.  Patient was treated with nebulized bronchodilators and IV steroids.  Patient says since discharge he is feeling better.  Has decreased cough and congestion.  Shortness of breath is returning back to baseline.  He remains on Breztri inhaler twice daily. Has few days left of steroid taper.  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 Rare albuterol use since coming home from hospital . Had had several flares this past summer requiring steroids.  Labs during hospital stay showed an elevated is in the fields with absolute count at 600  Retired recently from retail work. Occasionally walks 1 mile 2-3 times a week. Does light chores at home.     Allergies  Allergen Reactions   Hydrochlorothiazide Other (See Comments)    Immunization History  Administered Date(s) Administered   Influenza Inj Mdck Quad Pf 05/18/2019   Influenza Split 05/18/2011, 05/23/2012, 05/16/2014, 06/04/2018   Influenza Whole 05/17/2009, 05/17/2017   Influenza,inj,Quad PF,6+ Mos 08/07/2013, 05/30/2014, 04/18/2015, 05/06/2016, 05/12/2017, 06/04/2018,  05/24/2019, 06/19/2020, 07/02/2021   Influenza,inj,quad, With Preservative 05/03/2015   Moderna Sars-Covid-2 Vaccination 09/18/2019, 10/16/2019, 06/25/2020   Pneumococcal Conjugate-13 12/18/2015   Pneumococcal Polysaccharide-23 05/17/2008, 05/03/2015   Td 05/24/2019   Tdap 07/15/2009   Zoster Recombinat (Shingrix) 06/24/2018, 10/03/2018   Zoster, Live 06/24/2018, 10/03/2018    Past Medical History:  Diagnosis Date   COPD (chronic obstructive pulmonary disease) with emphysema (HCC)    Esophageal reflux    Exertional shortness of breath    "just today" (09/27/2013)   Hypertension     Tobacco History: Social History   Tobacco Use  Smoking Status Former   Packs/day: 1.00   Years: 30.00   Total pack years: 30.00   Types: Cigarettes   Quit date: 05/18/2007   Years since quitting: 14.9  Smokeless Tobacco Never   Counseling given: Not Answered   Outpatient Medications Prior to Visit  Medication Sig Dispense Refill   albuterol (PROAIR HFA) 108 (90 Base) MCG/ACT inhaler 2 puffs every 4 hours as needed only  if your can't catch your breath 1 each 1   albuterol (PROVENTIL) (2.5 MG/3ML) 0.083% nebulizer solution Take 3 mLs (2.5 mg total) by nebulization every 6 (six) hours as needed for wheezing or shortness of breath. 75 mL 12   aspirin 81 MG tablet Take 81 mg by mouth daily.     BREZTRI AEROSPHERE 160-9-4.8 MCG/ACT AERO INHALE 2 PUFFS INTO THE LUNGS 2 TIMES DAILY 10.7 g 11   fish oil-omega-3 fatty acids 1000 MG capsule Take 1 g by mouth daily.     irbesartan (AVAPRO) 150 MG tablet Take 1 tablet (150  mg total) by mouth daily. 30 tablet 11   Multiple Vitamin (MULTIVITAMIN WITH MINERALS) TABS tablet Take 1 tablet by mouth daily.     pantoprazole (PROTONIX) 40 MG tablet Take 40 mg by mouth daily.     predniSONE (DELTASONE) 10 MG tablet Take 1 tablet (10 mg total) by mouth daily. Take 6 pills a day for 5 days then 4 pills a day for 3 days then 2 pills a day for 3 days then continue taking  1 pill a day 90 tablet 0   Turmeric 500 MG CAPS Take 1 tablet by mouth daily.     Cholecalciferol (VITAMIN D-3) 5000 units TABS Take 1 tablet by mouth daily. (Patient not taking: Reported on 04/08/2022)     No facility-administered medications prior to visit.     Review of Systems:   Constitutional:   No  weight loss, night sweats,  Fevers, chills,  +fatigue, or  lassitude.  HEENT:   No headaches,  Difficulty swallowing,  Tooth/dental problems, or  Sore throat,                No sneezing, itching, ear ache, nasal congestion, post nasal drip,   CV:  No chest pain,  Orthopnea, PND, swelling in lower extremities, anasarca, dizziness, palpitations, syncope.   GI  No heartburn, indigestion, abdominal pain, nausea, vomiting, diarrhea, change in bowel habits, loss of appetite, bloody stools.   Resp:  No excess mucus, no productive cough,  No non-productive cough,  No coughing up of blood.  No change in color of mucus.  No wheezing.  No chest wall deformity  Skin: no rash or lesions.  GU: no dysuria, change in color of urine, no urgency or frequency.  No flank pain, no hematuria   MS:  No joint pain or swelling.  No decreased range of motion.  No back pain.    Physical Exam  BP (!) 140/70 (BP Location: Left Arm, Patient Position: Sitting, Cuff Size: Normal)   Pulse 94   Temp 98.2 F (36.8 C) (Oral)   Ht 5\' 8"  (1.727 m)   Wt 231 lb 12.8 oz (105.1 kg)   SpO2 93%   BMI 35.25 kg/m   GEN: A/Ox3; pleasant , NAD, well nourished    HEENT:  [place]/AT,   NOSE-clear, THROAT-clear, no lesions, no postnasal drip or exudate noted.   NECK:  Supple w/ fair ROM; no JVD; normal carotid impulses w/o bruits; no thyromegaly or nodules palpated; no lymphadenopathy.    RESP  Clear  P & A; w/o, wheezes/ rales/ or rhonchi. no accessory muscle use, no dullness to percussion  CARD:  RRR, no m/r/g, no peripheral edema, pulses intact, no cyanosis or clubbing.  GI:   Soft & nt; nml bowel sounds; no  organomegaly or masses detected.   Musco: Warm bil, no deformities or joint swelling noted.   Neuro: alert, no focal deficits noted.    Skin: Warm, no lesions or rashes    Lab Results:  CBC    Component Value Date/Time   WBC 15.8 (H) 03/26/2022 0522   RBC 5.01 03/26/2022 0522   HGB 15.1 03/26/2022 0522   HCT 45.6 03/26/2022 0522   PLT 193 03/26/2022 0522   MCV 91.0 03/26/2022 0522   MCH 30.1 03/26/2022 0522   MCHC 33.1 03/26/2022 0522   RDW 14.0 03/26/2022 0522   LYMPHSABS 1.9 03/24/2022 0510   MONOABS 1.1 (H) 03/24/2022 0510   EOSABS 0.6 (H) 03/24/2022 0510   BASOSABS 0.0 03/24/2022  0510    BMET    Component Value Date/Time   NA 140 03/25/2022 0514   K 4.4 03/25/2022 0514   CL 106 03/25/2022 0514   CO2 24 03/25/2022 0514   GLUCOSE 147 (H) 03/25/2022 0514   BUN 24 (H) 03/25/2022 0514   CREATININE 0.89 03/25/2022 0514   CALCIUM 9.0 03/25/2022 0514   GFRNONAA >60 03/25/2022 0514   GFRAA >60 12/01/2017 1303    BNP    Component Value Date/Time   BNP 16.5 03/24/2022 0510    ProBNP    Component Value Date/Time   PROBNP 44.0 02/05/2022 1603    Imaging: CT Angio Chest PE W and/or Wo Contrast  Result Date: 03/24/2022 CLINICAL DATA:  Shortness of breath for 1 month. Worsening over night. EXAM: CT ANGIOGRAPHY CHEST WITH CONTRAST TECHNIQUE: Multidetector CT imaging of the chest was performed using the standard protocol during bolus administration of intravenous contrast. Multiplanar CT image reconstructions and MIPs were obtained to evaluate the vascular anatomy. RADIATION DOSE REDUCTION: This exam was performed according to the departmental dose-optimization program which includes automated exposure control, adjustment of the mA and/or kV according to patient size and/or use of iterative reconstruction technique. CONTRAST:  OMNIPAQUE IOHEXOL 350 MG/ML SOLN COMPARISON:  Standard CT chest 11/24/2007. FINDINGS: Cardiovascular: The heart size is normal. No  substantial pericardial effusion. Coronary artery calcification is evident. Mild atherosclerotic calcification is noted in the wall of the thoracic aorta. There is no filling defect within the opacified pulmonary arteries to suggest the presence of an acute pulmonary embolus. Mediastinum/Nodes: No mediastinal lymphadenopathy. There is no hilar lymphadenopathy. The esophagus has normal imaging features. There is no axillary lymphadenopathy. Lungs/Pleura: Centrilobular and paraseptal emphysema evident. No focal airspace consolidation. No pleural effusion. No suspicious pulmonary nodule or mass. Upper Abdomen: Unremarkable. Musculoskeletal: No worrisome lytic or sclerotic osseous abnormality. Review of the MIP images confirms the above findings. IMPRESSION: 1. No CT evidence for acute pulmonary embolus. 2. Aortic Atherosclerosis (ICD10-I70.0) and Emphysema (ICD10-J43.9). Electronically Signed   By: Kennith Center M.D.   On: 03/24/2022 07:22   DG Chest Portable 1 View  Result Date: 03/24/2022 CLINICAL DATA:  Dyspnea and shortness of breath. EXAM: PORTABLE CHEST 1 VIEW COMPARISON:  Chest PA Lat 02/05/2022 FINDINGS: The heart size and mediastinal contours are within normal limits. Both lungs are clear. The visualized skeletal structures are unremarkable. There are multiple overlying monitor wires. IMPRESSION: No evidence of acute chest disease.  Stable chest. Electronically Signed   By: Almira Bar M.D.   On: 03/24/2022 05:27          No data to display          No results found for: "NITRICOXIDE"      Assessment & Plan:   COPD with acute exacerbation (HCC) Recurrent COPD exacerbations-may have a component of asthma overlap with allergic phenotype most recently hospitalized requiring additional steroids.  Hospital labs showed elevated eosinophils.  We will add in Singulair and Zyrtec. Continue on Breztri twice daily.  We will taper prednisone down slowly over the next 2 weeks.  Check PFTs on  return visit.  If continues to have flares consider adding an biologic such as Dupixent  Plan  Patient Instructions  Taper Prednisone 10mg  daily for 1 week then 5mg  daily for 1 week and then stop.  Add Singulair 10mg  daily.  Zyrtec 10mg  daily As needed  drainage.  Continue on Breztri 2 puffs twice daily, rinse after use Mucinex DM twice daily as needed  for cough and congestion Albuterol inhaler as needed Follow up with Dr. Sherene Sires in 6 weeks with PFT and As needed   Please contact office for sooner follow up if symptoms do not improve or worsen or seek emergency care '       Sharline Lehane, NP 04/08/2022

## 2022-04-21 ENCOUNTER — Ambulatory Visit: Payer: 59 | Admitting: Internal Medicine

## 2022-05-01 ENCOUNTER — Ambulatory Visit: Payer: 59 | Admitting: Student

## 2022-05-11 ENCOUNTER — Other Ambulatory Visit (HOSPITAL_COMMUNITY): Payer: Self-pay

## 2022-05-11 ENCOUNTER — Telehealth: Payer: Self-pay

## 2022-05-11 NOTE — Telephone Encounter (Signed)
Out of the [facility] Trelegy is covered but pt has high deductible, however I ran some other ones to see if you want to proceed with PA.    Received notification from [place] that prior authorization is required for [facility].   Test billing for this patient's plan returns the   following results for products:  ICS+LABA+LAMA Breztr- NON-FORMULARY DRUG Trelegy- $598.44 (deduc is over $8, 000)   ICS/LABA Advair hfa5 NON-FORMULARY DRUG, Breo $187.35 Dulera $66.25 Symbicortt $30.00   LAMA Incruse  NON-FORMULARY DRUG Spi handihaler daily $30.00 Spi respimat 34.99 Tudorza NON-FORMULARY.

## 2022-05-15 NOTE — Telephone Encounter (Signed)
[name], Please see test claim from pharmacy regarding inhalers.  Thank you.

## 2022-05-18 NOTE — Telephone Encounter (Signed)
Trelegy and Judithann Sauger are not covered by insurance.  Would recommend Symbicort 162 puffs twice daily and Spiriva Respimat 2 puffs daily  Please check with patient if he is okay with this each inhaler should be around $30-$35 each monthly

## 2022-05-25 NOTE — Telephone Encounter (Signed)
Called and spoke with patient, advised him of the recommendations per Rexene Edison NP.  He stated that on October 1st he went on [place] with Medicare and that it is  no longer a problem getting the [place].  Nothing further needed.

## 2022-06-01 ENCOUNTER — Other Ambulatory Visit: Payer: Self-pay | Admitting: *Deleted

## 2022-06-01 DIAGNOSIS — J441 Chronic obstructive pulmonary disease with (acute) exacerbation: Secondary | ICD-10-CM

## 2022-06-01 DIAGNOSIS — J449 Chronic obstructive pulmonary disease, unspecified: Secondary | ICD-10-CM

## 2022-06-02 ENCOUNTER — Encounter: Payer: Self-pay | Admitting: Internal Medicine

## 2022-06-02 ENCOUNTER — Ambulatory Visit: Payer: PPO | Admitting: Internal Medicine

## 2022-06-02 ENCOUNTER — Ambulatory Visit (INDEPENDENT_AMBULATORY_CARE_PROVIDER_SITE_OTHER): Payer: PPO | Admitting: Internal Medicine

## 2022-06-02 ENCOUNTER — Ambulatory Visit: Payer: 59 | Admitting: Student

## 2022-06-02 VITALS — BP 116/74 | HR 83 | Temp 98.1°F | Ht 68.0 in | Wt 233.0 lb

## 2022-06-02 DIAGNOSIS — Z23 Encounter for immunization: Secondary | ICD-10-CM | POA: Diagnosis not present

## 2022-06-02 DIAGNOSIS — J449 Chronic obstructive pulmonary disease, unspecified: Secondary | ICD-10-CM

## 2022-06-02 DIAGNOSIS — E669 Obesity, unspecified: Secondary | ICD-10-CM | POA: Diagnosis not present

## 2022-06-02 LAB — CBC WITH DIFFERENTIAL/PLATELET
Basophils Absolute: 0 10*3/uL (ref 0.0–0.1)
Basophils Relative: 0.6 % (ref 0.0–3.0)
Eosinophils Absolute: 0.3 10*3/uL (ref 0.0–0.7)
Eosinophils Relative: 3.2 % (ref 0.0–5.0)
HCT: 45.9 % (ref 39.0–52.0)
Hemoglobin: 15.7 g/dL (ref 13.0–17.0)
Lymphocytes Relative: 19.1 % (ref 12.0–46.0)
Lymphs Abs: 1.6 10*3/uL (ref 0.7–4.0)
MCHC: 34.1 g/dL (ref 30.0–36.0)
MCV: 87.9 fl (ref 78.0–100.0)
Monocytes Absolute: 1 10*3/uL (ref 0.1–1.0)
Monocytes Relative: 11.6 % (ref 3.0–12.0)
Neutro Abs: 5.4 10*3/uL (ref 1.4–7.7)
Neutrophils Relative %: 65.5 % (ref 43.0–77.0)
Platelets: 254 10*3/uL (ref 150.0–400.0)
RBC: 5.22 Mil/uL (ref 4.22–5.81)
RDW: 14.3 % (ref 11.5–15.5)
WBC: 8.2 10*3/uL (ref 4.0–10.5)

## 2022-06-02 LAB — PULMONARY FUNCTION TEST
DL/VA % pred: 95 %
DL/VA: 4.02 ml/min/mmHg/L
DLCO cor % pred: 83 %
DLCO cor: 21.1 ml/min/mmHg
DLCO unc % pred: 83 %
DLCO unc: 21.1 ml/min/mmHg
FEF 25-75 Post: 1.5 L/sec
FEF 25-75 Pre: 1.09 L/sec
FEF2575-%Change-Post: 38 %
FEF2575-%Pred-Post: 58 %
FEF2575-%Pred-Pre: 42 %
FEV1-%Change-Post: 7 %
FEV1-%Pred-Post: 64 %
FEV1-%Pred-Pre: 59 %
FEV1-Post: 2.08 L
FEV1-Pre: 1.93 L
FEV1FVC-%Change-Post: -2 %
FEV1FVC-%Pred-Pre: 84 %
FEV6-%Change-Post: 10 %
FEV6-%Pred-Post: 81 %
FEV6-%Pred-Pre: 74 %
FEV6-Post: 3.34 L
FEV6-Pre: 3.03 L
FEV6FVC-%Change-Post: 0 %
FEV6FVC-%Pred-Post: 104 %
FEV6FVC-%Pred-Pre: 105 %
FVC-%Change-Post: 10 %
FVC-%Pred-Post: 78 %
FVC-%Pred-Pre: 70 %
FVC-Post: 3.36 L
FVC-Pre: 3.03 L
Post FEV1/FVC ratio: 62 %
Post FEV6/FVC ratio: 99 %
Pre FEV1/FVC ratio: 63 %
Pre FEV6/FVC Ratio: 100 %
RV % pred: 142 %
RV: 3.15 L
TLC % pred: 98 %
TLC: 6.53 L

## 2022-06-02 NOTE — Patient Instructions (Signed)
Full PFT performed today. °

## 2022-06-02 NOTE — Progress Notes (Signed)
Full PFT performed today. °

## 2022-06-02 NOTE — Patient Instructions (Addendum)
No change in medications but if flare again : Prednisone 10 mg take  4 each am x 2 days,   2 each am x 2 days,  1 each am x 2 days and stop   Please remember to go to the lab department   for your tests - we will call you with the results when they are available.      Please schedule a follow up visit in 3 months but call sooner if needed

## 2022-06-02 NOTE — Assessment & Plan Note (Addendum)
Quit smoking 2008 PFT's 2008:  FEV1 1.08 (33%), ratio 43, ++BD response, +airtrapping, DLCO 78%  - Spirometry 01/12/2017  FEV1 1.92 (57%)  Ratio 60 p am symbicort  160 - 10/04/2018  After extensive coaching inhaler device,  effectiveness =    90%   - 10/02/2020  Trial of breztri 2bid  > marked improvement 10/15/2021    - PFT's  06/02/2022   FEV1 2.08 (64 % ) ratio 0.62  p 7 % improvement from saba p breztri prior to study with DLCO  18.85 (57%)   and FV very mild  concavity and ERV 8%  at wt 233    - Labs ordered 06/02/2022  :  allergy profile   alpha one AT phenotype     Group D (now reclassified as E) in terms of symptom/risk and laba/lama/ICS  therefore appropriate rx at this point >>>  breztri and approp saba  Re SABA :  I spent extra time with pt today reviewing appropriate use of albuterol for prn use on exertion with the following points: 1) saba is for relief of sob that does not improve by walking a slower pace or resting but rather if the pt does not improve after trying this first. 2) If the pt is convinced, as many are, that saba helps recover from activity faster then it's easy to tell if this is the case by re-challenging : ie stop, take the inhaler, then p 5 minutes try the exact same activity (intensity of workload) that just caused the symptoms and see if they are substantially diminished or not after saba 3) if there is an activity that reproducibly causes the symptoms, try the saba 15 min before the activity on alternate days   If in fact the saba really does help, then fine to continue to use it prn but advised may need to look closer at the maintenance regimen being used to achieve better control of airways disease with exertion.

## 2022-06-02 NOTE — Progress Notes (Signed)
Subjective:     Patient ID: [name], male   DOB: 1955/12/13    MRN: [id number]    Brief patient profile:  66 yowm quit smoking in 2008  At wt 160 with copd/AB (GOLD III criteria with marked reversibility)  on ACEi and advair with baseline MMRC2 = can't walk a nl pace on a flat grade s sob but does fine slow and flat  / prev folllowed by Cl[facility]/ Sood but only GOLD II as of 01/12/2017 off acei/ advair and on symb 160 2bid     History of Present Illness  12/25/2016 1st [facility] office visit/ [name]   Chief Complaint  Patient presents with   Acute Visit    Has been feeling tired for the past week. Fever, non-productive cough. Took some Dayquil around 6am this morning.    acute onset with sore throat/ hoarseness but chronically bothered by dry raspy daytime cough and limiting down on advair 250 and using lots of saba baseline and with exac s benefit rec zpak  Stop lisinopril and start avapro 150 mg daily instead Stop advair and start symbicort 160 Take 2 puffs first thing in am and then another 2 puffs about 12 hours later.  Continue  nexium Take 30-60 min before first meal of the day and add pepcid 20mg  at bedtime until better GERD diet   10/02/2020  f/u ov/[name] re: COPD II/ AB Chief Complaint  Patient presents with   Follow-up    Breathing is doing well and no new co's today. He has not used his rescue inhaler since last visit.    Dyspnea:  MMRC1 = can walk nl pace, flat grade, can't hurry or go uphills or steps s sob   Cough: none  Sleeping: flat bed/ one head pillow SABA use: none  02: none Covid status: vax x 3   Rec Call back if your desire lung cancer screening > scheduled with Eric Form NP  Ok to try breztri Take 2 puffs first thing in am and then another 2 puffs about 12 hours later.    10/15/2021  f/u ov/Jeselle Hiser re: GOLD 2 AB   maint on breztri   Chief Complaint  Patient presents with   Follow-up    Follow up. Patient has no complaints.   Dyspnea:  walking  all day Cough: no Sleeping: flat bed one pillow SABA use: none  02: none  Rec No change rx    Admit date: 03/24/2022 Discharge date: 03/26/2022    Brief/Interim Summary:   Patient is a 66 year old male with history of hypertension, hyperlipidemia, GERD, COPD who presented with shortness of breath.  He was recently treated for pneumonia with antibiotics and steroids.  When he finished the steroids, his symptoms returned.  He was also complaining of increased cough, short of breath.  Patient was admitted for the management of COPD exacerbation.  PCCM also consulted.  Patient was started on IV steroids.  Patient's respiratory status continued to improve, currently he is free of wheezing and is saturating fine on room air today.  He is medically stable for discharge to home with tapering dose of prednisone.  He will follow-up with pulmonology as an outpatient.   Following problems were addressed during his hospitalization:   Acute COPD exacerbation: Presented with shortness of breath, increased cough.  Recently treated for pneumonia.  Has several ED visits for the same.  PCCM consulted.  Started on IV steroids. CT negative for pneumonia.  COVID/flu negative.  RVP negative.  Treated with nebulizing treatment, Mucinex. He needs to continue follow-up with PCCM as an outpatient. He was weaned off oxygen this morning.  PCCM cleared for discharge.     Discharge Diagnoses:  Principal Problem:   COPD exacerbation ([place])   COPD with acute exacerbation ([place])   GERD (gastroesophageal reflux disease)   Essential hypertension   Acute respiratory failure with hypoxia (HCC)   HLD (hyperlipidemia)   Hyperglycemia   Tobacco abuse      06/02/2022  f/u ov/[name] re: GOLD 2    maint on breztri  / singulair - really seemed to help Chief Complaint  Patient presents with   Follow-up    PT had PFT today at 89, no complaints.  Dyspnea:  walking 30 min some hills Cough: none Sleeping: flat bed one pillow   SABA use: none  02: none    No obvious day to day or daytime variability or assoc excess/ purulent sputum or mucus plugs or hemoptysis or cp or chest tightness, subjective wheeze or overt sinus or hb symptoms.   Sleeping  without nocturnal  or early am exacerbation  of respiratory  c/o's or need for noct saba. Also denies any obvious fluctuation of symptoms with weather or environmental changes or other aggravating or alleviating factors except as outlined above   No unusual exposure hx or h/o childhood pna/ asthma or knowledge of premature birth.  Current Allergies, Complete Past Medical History, Past Surgical History, Family History, and Social History were reviewed in [employer] record.  ROS  The following are not active complaints unless bolded Hoarseness, sore throat, dysphagia, dental problems, itching, sneezing,  nasal congestion or discharge of excess mucus or purulent secretions, ear ache,   fever, chills, sweats, unintended wt loss or wt gain, classically pleuritic or exertional cp,  orthopnea pnd or arm/hand swelling  or leg swelling, presyncope, palpitations, abdominal pain, anorexia, nausea, vomiting, diarrhea  or change in bowel habits or change in bladder habits, change in stools or change in urine, dysuria, hematuria,  rash, arthralgias, visual complaints, headache, numbness, weakness or ataxia or problems with walking or coordination,  change in mood or  memory.        Current Meds  Medication Sig   albuterol (PROAIR HFA) 108 (90 Base) MCG/ACT inhaler 2 puffs every 4 hours as needed only  if your can't catch your breath   albuterol (PROVENTIL) (2.5 MG/3ML) 0.083% nebulizer solution Take 3 mLs (2.5 mg total) by nebulization every 6 (six) hours as needed for wheezing or shortness of breath.   aspirin 81 MG tablet Take 81 mg by mouth daily.   BREZTRI AEROSPHERE 160-9-4.8 MCG/ACT AERO INHALE 2 PUFFS INTO THE LUNGS 2 TIMES DAILY   fish oil-omega-3 fatty  acids 1000 MG capsule Take 1 g by mouth daily.   irbesartan (AVAPRO) 150 MG tablet Take 1 tablet (150 mg total) by mouth daily.   montelukast (SINGULAIR) 10 MG tablet Take 1 tablet (10 mg total) by mouth at bedtime.   Multiple Vitamin (MULTIVITAMIN WITH MINERALS) TABS tablet Take 1 tablet by mouth daily.   pantoprazole (PROTONIX) 40 MG tablet Take 40 mg by mouth daily.   Turmeric 500 MG CAPS Take 1 tablet by mouth daily.          Objective:   Physical Exam  Wt  06/02/2022       233  10/15/2021         226  10/02/2020       229 10/03/2019  224  10/04/2018       228  10/05/2017       225  04/13/2017       225   01/12/2017      220   12/25/16 219 lb (99.3 kg)  12/18/15 221 lb 9.6 oz (100.5 kg)  12/05/14 219 lb 3.2 oz (99.4 kg)    Vital signs reviewed  06/02/2022  - Note at rest 02 sats  94% on RA   General appearance:    amb mod obese wm nad     HEENT : Oropharynx  clear       NECK :  without  apparent JVD/ palpable Nodes/TM    LUNGS: no acc muscle use,  Min barrel  contour chest wall with bilateral  slightly decreased bs s audible wheeze and  without cough on insp or exp maneuvers and min  Hyperresonant  to  percussion bilaterally    CV:  RRR  no s3 or murmur or increase in P2, and no edema   ABD:  obese soft and nontender with pos end  insp Hoover's  in the supine position.  No bruits or organomegaly appreciated   MS:  Nl gait/ ext warm without deformities Or obvious joint restrictions  calf tenderness, cyanosis or clubbing     SKIN: warm and dry without lesions    NEURO:  alert, approp, nl sensorium with  no motor or cerebellar deficits apparent.         I personally reviewed images and agree with radiology impression as follows:   Chest CTa  03/24/22  1. No CT evidence for acute pulmonary embolus. 2. Aortic Atherosclerosis (ICD10-I70.0) and Emphysema (ICD10-J43.9).     Assessment:

## 2022-06-02 NOTE — Assessment & Plan Note (Signed)
Complicated by ERV 8% @ wt 233 06/02/2022   Body mass index is 35.43 kg/m.  -  trending up  No results found for: "TSH"    Contributing to doe and risk of GERD/DVT >>>   reviewed the need and the process to achieve and maintain neg calorie balance > defer f/u primary care including intermittently monitoring thyroid status          Each maintenance medication was reviewed in detail including emphasizing most importantly the difference between maintenance and prns and under what circumstances the prns are to be triggered using an action plan format where appropriate.  Total time for H and P, chart review, counseling, reviewing hfa/ neb  device(s) and generating customized AVS unique to this office visit / same day charting  > 30 min

## 2022-06-03 LAB — IGE: IgE (Immunoglobulin E), Serum: 84 kU/L (ref <=114)

## 2022-06-13 LAB — ALPHA-1-ANTITRYPSIN PHENOTYP: A-1 Antitrypsin: 146 mg/dL (ref 101–187)

## 2022-09-07 ENCOUNTER — Encounter: Payer: Self-pay | Admitting: Internal Medicine

## 2022-09-07 ENCOUNTER — Ambulatory Visit (INDEPENDENT_AMBULATORY_CARE_PROVIDER_SITE_OTHER): Payer: PPO | Admitting: Internal Medicine

## 2022-09-07 VITALS — BP 142/82 | HR 71 | Temp 98.3°F | Ht 68.0 in | Wt 213.8 lb

## 2022-09-07 DIAGNOSIS — J449 Chronic obstructive pulmonary disease, unspecified: Secondary | ICD-10-CM | POA: Diagnosis not present

## 2022-09-07 NOTE — Patient Instructions (Signed)
No change in medications    Please schedule a follow up visit in 6  months but call sooner if needed  

## 2022-09-07 NOTE — Assessment & Plan Note (Signed)
Quit smoking 2008/MM PFT's 2008:  FEV1 1.08 (33%), ratio 43, ++BD response, +airtrapping, DLCO 78%  - Spirometry 01/12/2017  FEV1 1.92 (57%)  Ratio 60 p am symbicort  160 - 10/04/2018  After extensive coaching inhaler device,  effectiveness =    90%   - 10/02/2020  Trial of breztri 2bid  > marked improvement 10/15/2021  - PFT's  06/02/2022   FEV1 2.08 (64 % ) ratio 0.62  p 7 % improvement from saba p breztri prior to study with DLCO  18.85 (57%)   and FV very mild  concavity and ERV 8%  at wt 233    - Labs ordered 06/02/2022  :  allergy screen  0.3   IgE  84  alpha one AT phenotype  MM  Level 146    Group D (now reclassified as E) in terms of symptom/risk and laba/lama/ICS  therefore appropriate rx at this point >>>  breztri and approp saba plus singulair appears to be helping the upper airway/ allergy component > no changes needed  F/u in 6 m, call sooner         Each maintenance medication was reviewed in detail including emphasizing most importantly the difference between maintenance and prns and under what circumstances the prns are to be triggered using an action plan format where appropriate.  Total time for H and P, chart review, counseling, reviewing hfa  device(s) and generating customized AVS unique to this office visit / same day charting = 22 min

## 2022-09-07 NOTE — Progress Notes (Signed)
Subjective:     Patient ID: [name], male   DOB: Jan 18, 1956    MRN: [id number]    Brief patient profile:  66 yowm quit smoking in 2008  At wt 160 with copd/AB (GOLD III criteria with marked reversibility)  on ACEi and advair with baseline MMRC2 = can't walk a nl pace on a flat grade s sob but does fine slow and flat  / prev folllowed by Clance/ Sood but only GOLD 2  as of 01/12/2017 off acei/ advair and on symb 160 2bid     History of Present Illness  12/25/2016 1st [facility] office visit/ [name]   Chief Complaint  Patient presents with   Acute Visit    Has been feeling tired for the past week. Fever, non-productive cough. Took some Dayquil around 6am this morning.    acute onset with sore throat/ hoarseness but chronically bothered by dry raspy daytime cough and limiting down on advair 250 and using lots of saba baseline and with exac s benefit rec zpak  Stop lisinopril and start avapro 150 mg daily instead Stop advair and start symbicort 160 Take 2 puffs first thing in am and then another 2 puffs about 12 hours later.  Continue  nexium Take 30-60 min before first meal of the day and add pepcid 20mg  at bedtime until better GERD diet   10/02/2020  f/u ov/[name] re: COPD II/ AB Chief Complaint  Patient presents with   Follow-up    Breathing is doing well and no new co's today. He has not used his rescue inhaler since last visit.    Dyspnea:  MMRC1 = can walk nl pace, flat grade, can't hurry or go uphills or steps s sob   Cough: none  Sleeping: flat bed/ one head pillow SABA use: none  02: none Covid status: vax x 3   Rec Call back if your desire lung cancer screening > scheduled with Dale Form NP  Ok to try breztri Take 2 puffs first thing in am and then another 2 puffs about 12 hours later.    10/15/2021  f/u ov/[name] re: GOLD 2 AB   maint on breztri   Chief Complaint  Patient presents with   Follow-up    Follow up. Patient has no complaints.   Dyspnea:  walking  all day Cough: no Sleeping: flat bed one pillow SABA use: none  02: none  Rec No change rx    Admit date: 03/24/2022 Discharge date: 03/26/2022    Brief/Interim Summary:   Patient is a 67 year old male with history of hypertension, hyperlipidemia, GERD, COPD who presented with shortness of breath.  He was recently treated for pneumonia with antibiotics and steroids.  When he finished the steroids, his symptoms returned.  He was also complaining of increased cough, short of breath.  Patient was admitted for the management of COPD exacerbation.  PCCM also consulted.  Patient was started on IV steroids.  Patient's respiratory status continued to improve, currently he is free of wheezing and is saturating fine on room air today.  He is medically stable for discharge to home with tapering dose of prednisone.  He will follow-up with pulmonology as an outpatient.   Following problems were addressed during his hospitalization:   Acute COPD exacerbation: Presented with shortness of breath, increased cough.  Recently treated for pneumonia.  Has several ED visits for the same.  PCCM consulted.  Started on IV steroids. CT negative for pneumonia.  COVID/flu negative.  RVP  negative. Treated with nebulizing treatment, Mucinex. He needs to continue follow-up with PCCM as an outpatient. He was weaned off oxygen this morning.  PCCM cleared for discharge.     Discharge Diagnoses:  Principal Problem:   COPD exacerbation ([facility])   GERD (gastroesophageal reflux disease)   Essential hypertension   Acute respiratory failure with hypoxia (HCC)   HLD (hyperlipidemia)   Hyperglycemia   Tobacco abuse    06/02/2022  f/u ov/[name] re: GOLD 2    maint on breztri  / singulair - really seemed to help Chief Complaint  Patient presents with   Follow-up    PT had PFT today at 24, no complaints.  Dyspnea:  walking 30 min some hills Cough: none Sleeping: flat bed one pillow  SABA use: none  02: none  Rec No  change in medications but if flare again : Prednisone 10 mg take  4 each am x 2 days,   2 each am x 2 days,  1 each am x 2 days and stop    06/02/2022  :  allergy screen  0.3   IgE  84  alpha one AT phenotype  MM  Level 146   09/07/2022  f/u ov/[name] re: GOLD 2    maint on breztri   Chief Complaint  Patient presents with   Follow-up    Doing well.  Breathing very good.  Dyspnea:  walking up 30 min, some hills  Cough: no  Sleeping: none  SABA use: no saba or hfa  02: none / sats  Covid status:   vax all but present shot     No obvious day to day or daytime variability or assoc excess/ purulent sputum or mucus plugs or hemoptysis or cp or chest tightness, subjective wheeze or overt sinus or hb symptoms.   Sleeping  without nocturnal  or early am exacerbation  of respiratory  c/o's or need for noct saba. Also denies any obvious fluctuation of symptoms with weather or environmental changes or other aggravating or alleviating factors except as outlined above   No unusual exposure hx or h/o childhood pna/ asthma or knowledge of premature birth.  Current Allergies, Complete Past Medical History, Past Surgical History, Family History, and Social History were reviewed in [employer] record.  ROS  The following are not active complaints unless bolded Hoarseness, sore throat, dysphagia, dental problems, itching, sneezing,  nasal congestion or discharge of excess mucus or purulent secretions, ear ache,   fever, chills, sweats, unintended wt loss or wt gain, classically pleuritic or exertional cp,  orthopnea pnd or arm/hand swelling  or leg swelling, presyncope, palpitations, abdominal pain, anorexia, nausea, vomiting, diarrhea  or change in bowel habits or change in bladder habits, change in stools or change in urine, dysuria, hematuria,  rash, arthralgias, visual complaints, headache, numbness, weakness or ataxia or problems with walking or coordination,  change in mood or   memory.        Current Meds  Medication Sig   albuterol (PROAIR HFA) 108 (90 Base) MCG/ACT inhaler 2 puffs every 4 hours as needed only  if your can't catch your breath   albuterol (PROVENTIL) (2.5 MG/3ML) 0.083% nebulizer solution Take 3 mLs (2.5 mg total) by nebulization every 6 (six) hours as needed for wheezing or shortness of breath.   aspirin 81 MG tablet Take 81 mg by mouth daily.   BREZTRI AEROSPHERE 160-9-4.8 MCG/ACT AERO INHALE 2 PUFFS INTO THE LUNGS 2 TIMES DAILY   fish oil-omega-3  fatty acids 1000 MG capsule Take 1 g by mouth daily.   irbesartan (AVAPRO) 150 MG tablet Take 1 tablet (150 mg total) by mouth daily.   montelukast (SINGULAIR) 10 MG tablet Take 1 tablet (10 mg total) by mouth at bedtime.   Multiple Vitamin (MULTIVITAMIN WITH MINERALS) TABS tablet Take 1 tablet by mouth daily.   pantoprazole (PROTONIX) 40 MG tablet Take 40 mg by mouth daily.   Turmeric 500 MG CAPS Take 1 tablet by mouth daily.            Objective:   Physical Exam  Wt  09/07/2022         213  06/02/2022       233  10/15/2021         226  10/02/2020       229 10/03/2019       224  10/04/2018       228  10/05/2017       225  04/13/2017       225   01/12/2017      220   12/25/16 219 lb (99.3 kg)  12/18/15 221 lb 9.6 oz (100.5 kg)  12/05/14 219 lb 3.2 oz (99.4 kg)    Vital signs reviewed  09/07/2022  - Note at rest 02 sats  95% on RA   General appearance:    amb pleasant wm all smiles     HEENT : Oropharynx  clear/no thrush/ occ throat clearing   Nasal turbinates nl    NECK :  without  apparent JVD/ palpable Nodes/TM    LUNGS: no acc muscle use,  Min barrel  contour chest wall with bilateral  slightly decreased bs s audible wheeze and  without cough on insp or exp maneuvers and min  Hyperresonant  to  percussion bilaterally    CV:  RRR  no s3 or murmur or increase in P2, and no edema   ABD:  soft and nontender with pos end  insp Hoover's  in the supine position.  No bruits or organomegaly  appreciated   MS:  Nl gait/ ext warm without deformities Or obvious joint restrictions  calf tenderness, cyanosis or clubbing     SKIN: warm and dry without lesions    NEURO:  alert, approp, nl sensorium with  no motor or cerebellar deficits apparent.             Assessment:

## 2022-10-22 IMAGING — DX DG CHEST 2V
2 series · 2 of 2 positions shown · non-contrast
Comparison: Chest x-ray dated June 23, 2021.

CLINICAL DATA: Shortness of breath and wheezing.

EXAM:
CHEST - 2 VIEW

[chest pa]
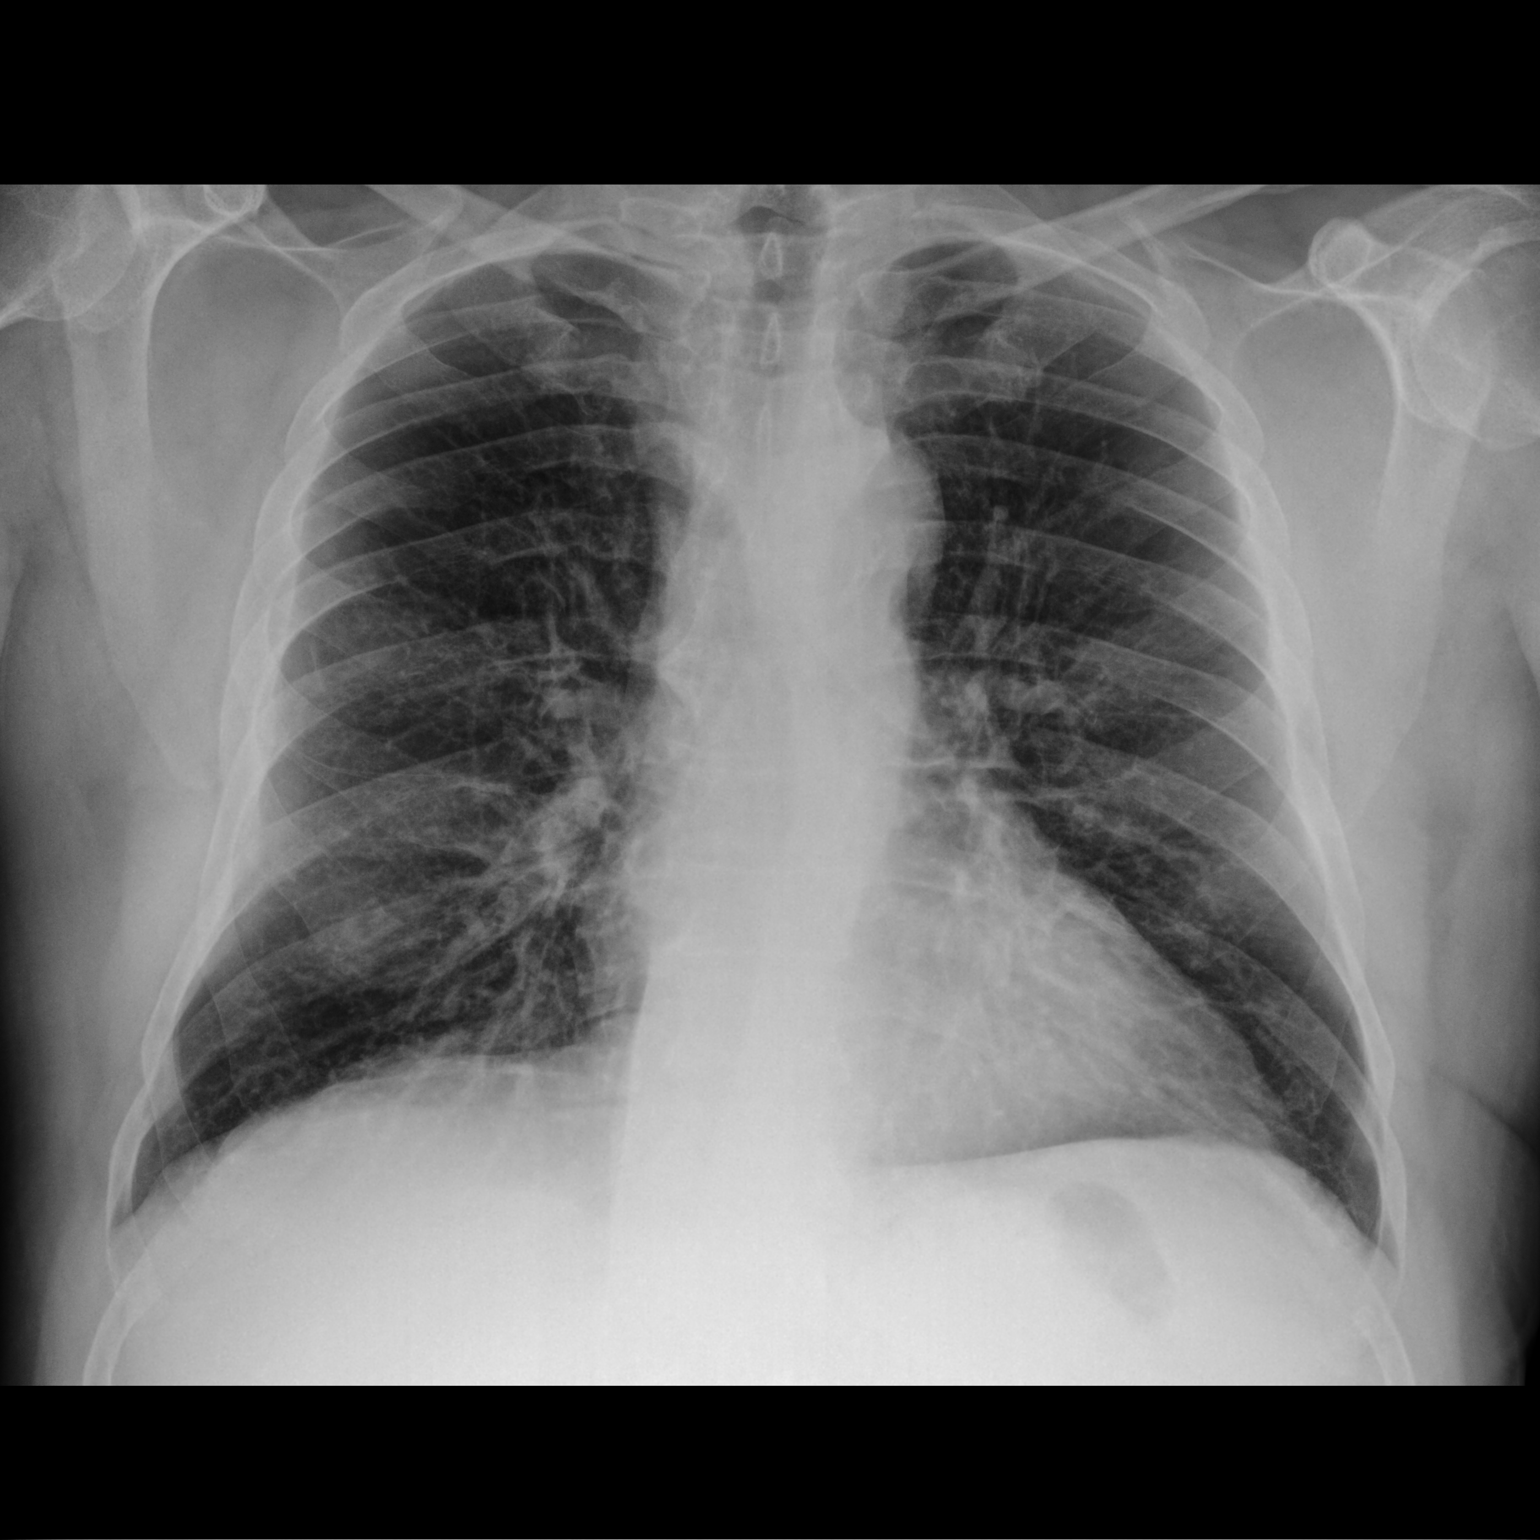

[chest lat]
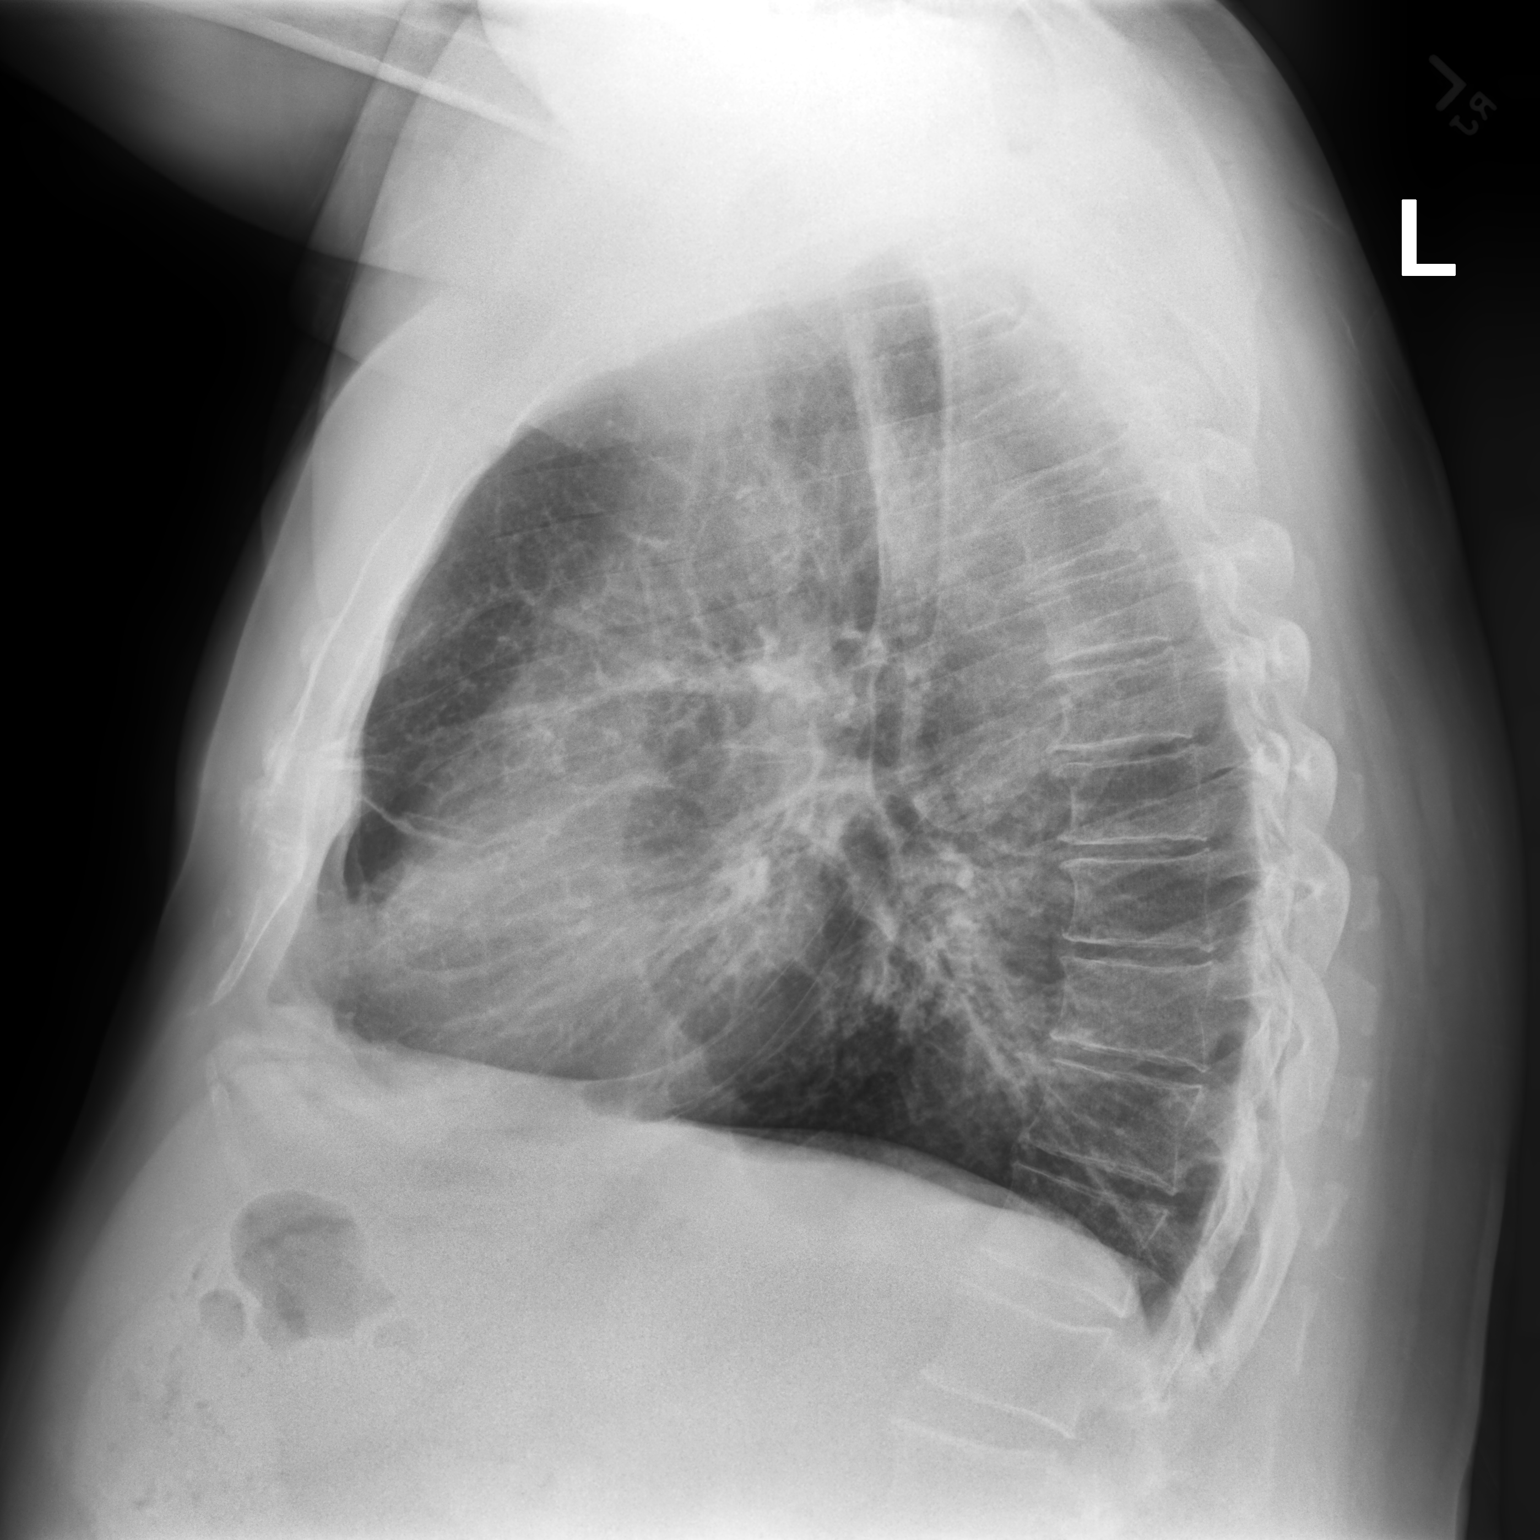

[2 of 2 positions shown; findings below may reference images not displayed]

FINDINGS: The heart size and mediastinal contours are within normal limits.
Both lungs are clear. The visualized skeletal structures are
unremarkable.
IMPRESSION: No active cardiopulmonary disease.

## 2022-11-14 ENCOUNTER — Other Ambulatory Visit: Payer: Self-pay | Admitting: Internal Medicine

## 2022-11-19 ENCOUNTER — Other Ambulatory Visit: Payer: Self-pay | Admitting: Internal Medicine

## 2023-03-13 ENCOUNTER — Other Ambulatory Visit: Payer: Self-pay | Admitting: Adult Health

## 2023-06-28 ENCOUNTER — Other Ambulatory Visit: Payer: Self-pay

## 2023-06-28 MED ORDER — MONTELUKAST SODIUM 10 MG PO TABS: 10.0000 mg | ORAL_TABLET | Freq: Every day | ORAL | 0 refills | Status: DC

## 2023-09-05 NOTE — Progress Notes (Unsigned)
Patient ID: [name], male   DOB: 08-04-1956    MRN: [id number]    Brief patient profile:  7 yowm quit smoking in 2008  At wt 160 with copd/AB (GOLD III criteria with marked reversibility)  on ACEi and advair with baseline MMRC2 = can't walk a nl pace on a flat grade s sob but does fine slow and flat  / prev folllowed by Clance/ Sood but only GOLD 2  as of 01/12/2017 off acei/ advair and on symb 160 2bid    History of Present Illness  12/25/2016 1st [facility] office visit/ [name]   Chief Complaint  Patient presents with   Acute Visit    Has been feeling tired for the past week. Fever, non-productive cough. Took some Dayquil around 6am this morning.    acute onset with sore throat/ hoarseness but chronically bothered by dry raspy daytime cough and limiting down on advair 250 and using lots of saba baseline and with exac s benefit rec zpak  Stop lisinopril and start avapro 150 mg daily instead Stop advair and start symbicort 160 Take 2 puffs first thing in am and then another 2 puffs about 12 hours later.  Continue  nexium Take 30-60 min before first meal of the day and add pepcid 20mg  at bedtime until better GERD diet    Admit date: 03/24/2022 Discharge date: 03/26/2022 Discharge Diagnoses: :   COPD exacerbation (HCC)   GERD (gastroesophageal reflux disease)   Essential hypertension   Acute respiratory failure with hypoxia (HCC)   HLD (hyperlipidemia)   Hyperglycemia   Tobacco abuse     06/02/2022  :  allergy screen  0.3   IgE  84  alpha one AT phenotype  MM  Level 146    09/07/2022  f/u ov/[name] re: GOLD 2    maint on breztri   Chief Complaint  Patient presents with   Follow-up    Doing well.  Breathing very good.  Dyspnea:  walking up 30 min, some hills  Cough: no  Sleeping: none  SABA use: no saba or hfa  02: none / sats  Covid status:   vax all but present shot  Rec No change rx    09/07/2023  f/u ov/[name] re: GOLD 2 copd    maint on breztri/  singulair and prn saba   Chief Complaint  Patient presents with   Follow-up    Doing well.  Wants info on RSV vaccination.    Dyspnea:  walking 30 min some hills no change ex tol  Cough: none  Sleeping: flat bed one pillow s  resp cc  SABA use: none  02: none   No obvious day to day or daytime variability or assoc excess/ purulent sputum or mucus plugs or hemoptysis or cp or chest tightness, subjective wheeze or overt sinus or hb symptoms.    Also denies any obvious fluctuation of symptoms with weather or environmental changes or other aggravating or alleviating factors except as outlined above   No unusual exposure hx or h/o childhood pna/ asthma or knowledge of premature birth.  Current Allergies, Complete Past Medical History, Past Surgical History, Family History, and Social History were reviewed in [employer] record.  ROS  The following are not active complaints unless bolded Hoarseness, sore throat, dysphagia, dental problems, itching, sneezing,  nasal congestion or discharge of excess mucus or purulent secretions, ear ache,   fever, chills, sweats, unintended wt loss or wt gain,  classically pleuritic or exertional cp,  orthopnea pnd or arm/hand swelling  or leg swelling, presyncope, palpitations, abdominal pain, anorexia, nausea, vomiting, diarrhea  or change in bowel habits or change in bladder habits, change in stools or change in urine, dysuria, hematuria,  rash, arthralgias, visual complaints, headache, numbness, weakness or ataxia or problems with walking or coordination,  change in mood or  memory.        Current Meds  Medication Sig   albuterol (PROAIR HFA) 108 (90 Base) MCG/ACT inhaler 2 puffs every 4 hours as needed only  if your can't catch your breath   albuterol (PROVENTIL) (2.5 MG/3ML) 0.083% nebulizer solution Take 3 mLs (2.5 mg total) by nebulization every 6 (six) hours as needed for wheezing or shortness of breath.    Budeson-Glycopyrrol-Formoterol (BREZTRI AEROSPHERE) 160-9-4.8 MCG/ACT AERO INHALE 2 PUFFS BY MOUTH INTO LUNGS TWICE A DAY   fish oil-omega-3 fatty acids 1000 MG capsule Take 1 g by mouth daily.   irbesartan (AVAPRO) 150 MG tablet Take 1 tablet (150 mg total) by mouth daily.   montelukast (SINGULAIR) 10 MG tablet Take 1 tablet (10 mg total) by mouth at bedtime.   Multiple Vitamin (MULTIVITAMIN WITH MINERALS) TABS tablet Take 1 tablet by mouth daily.   pantoprazole (PROTONIX) 40 MG tablet Take 40 mg by mouth daily.   Turmeric 500 MG CAPS Take 1 tablet by mouth daily.           Objective:   Physical Exam  Wt  09/07/2023         198  09/07/2022         213  06/02/2022       233  10/15/2021         226  10/02/2020       229 10/03/2019       224  10/04/2018       228  10/05/2017       225  04/13/2017       225   01/12/2017      220   12/25/16 219 lb (99.3 kg)  12/18/15 221 lb 9.6 oz (100.5 kg)  12/05/14 219 lb 3.2 oz (99.4 kg)    Vital signs reviewed  09/07/2023  - Note at rest 02 sats  95% on RA   General appearance:    amb pleasant wm nad    HEENT : Oropharynx  clear   Nasal turbinates nl    NECK :  without  apparent JVD/ palpable Nodes/TM    LUNGS: no acc muscle use,  Min barrel  contour chest wall with bilateral  slightly decreased bs s audible wheeze and  without cough on insp or exp maneuvers and min  Hyperresonant  to  percussion bilaterally    CV:  RRR  no s3 or murmur or increase in P2, and no edema   ABD:  soft and nontender with pos end  insp Hoover's  in the supine position.  No bruits or organomegaly appreciated   MS:  Nl gait/ ext warm without deformities Or obvious joint restrictions  calf tenderness, cyanosis or clubbing     SKIN: warm and dry without lesions    NEURO:  alert, approp, nl sensorium with  no motor or cerebellar deficits apparent.         Assessment:

## 2023-09-07 ENCOUNTER — Encounter: Payer: Self-pay | Admitting: Internal Medicine

## 2023-09-07 ENCOUNTER — Ambulatory Visit (INDEPENDENT_AMBULATORY_CARE_PROVIDER_SITE_OTHER): Payer: PPO | Admitting: Internal Medicine

## 2023-09-07 VITALS — BP 122/78 | HR 76 | Temp 98.1°F | Ht 68.0 in | Wt 199.2 lb

## 2023-09-07 DIAGNOSIS — J449 Chronic obstructive pulmonary disease, unspecified: Secondary | ICD-10-CM | POA: Diagnosis not present

## 2023-09-07 NOTE — Patient Instructions (Signed)
No change in medications  I agree with RSV vaccine per your PCP or drugstore   Please schedule a follow up visit in 12  months but call sooner if needed here or [place]

## 2023-09-07 NOTE — Assessment & Plan Note (Signed)
Quit smoking 2008/MM PFT's 2008:  FEV1 1.08 (33%), ratio 43, ++BD response, +airtrapping, DLCO 78%  - Spirometry 01/12/2017  FEV1 1.92 (57%)  Ratio 60 p am symbicort  160 - 10/04/2018  After extensive coaching inhaler device,  effectiveness =    90%   - 10/02/2020  Trial of breztri 2bid  > marked improvement 10/15/2021  - PFT's  06/02/2022   FEV1 2.08 (64 % ) ratio 0.62  p 7 % improvement from saba p breztri prior to study with DLCO  18.85 (57%)   and FV very mild  concavity and ERV 8%  at wt 233    - Labs ordered 06/02/2022  :  allergy screen  0.3   IgE  84  alpha one AT phenotype  MM  Level 146    Group D (now reclassified as E) in terms of symptom/risk and laba/lama/ICS  therefore appropriate rx at this point >>>  breztri and approp saba   No flares or increased need for saba, decreased ex tol so f/u q 12 m, call sooner prn same meds plus RSV vaccine endorsed         Each maintenance medication was reviewed in detail including emphasizing most importantly the difference between maintenance and prns and under what circumstances the prns are to be triggered using an action plan format where appropriate.  Total time for H and P, chart review, counseling, reviewing hfa/neb  device(s) and generating customized AVS unique to this office visit / same day charting = 

## 2023-09-20 ENCOUNTER — Other Ambulatory Visit: Payer: Self-pay | Admitting: Internal Medicine

## 2023-11-22 ENCOUNTER — Other Ambulatory Visit: Payer: Self-pay | Admitting: Internal Medicine

## 2023-11-22 DIAGNOSIS — R809 Proteinuria, unspecified: Secondary | ICD-10-CM | POA: Diagnosis not present

## 2023-11-22 MED ORDER — MONTELUKAST SODIUM 10 MG PO TABS: 10.0000 mg | ORAL_TABLET | Freq: Every day | ORAL | 0 refills | Status: DC

## 2023-11-22 MED ORDER — BREZTRI AEROSPHERE 160-9-4.8 MCG/ACT IN AERO
2.0000 | INHALATION_SPRAY | Freq: Two times a day (BID) | RESPIRATORY_TRACT | 5 refills | Status: DC
Start: 2023-11-22 — End: 2024-05-22

## 2023-11-22 NOTE — Telephone Encounter (Signed)
 Copied from CRM [id number]. Topic: Clinical - Medication Refill >> Nov 22, 2023  9:58 AM Nila Nephew wrote: Most Recent Primary Care Visit:   Medication:  1. Budeson-Glycopyrrol-Formoterol (BREZTRI AEROSPHERE) 160-9-4.8 MCG/ACT AERO - states would like 3 month supply if possible for cost reasons  2. montelukast (SINGULAIR) 10 MG tablet - does not need now but will need sooner  Has the patient contacted their pharmacy? Yes  Is this the correct pharmacy for this prescription? Yes  This is the patient's preferred pharmacy:  [facility] [id number] - [place], [place] - [address] [place] [address] Phone: [phone] Fax: [phone]   Has the prescription been filled recently? No  Is the patient out of the medication? No - Has about a half a month left but is calling ahead.   Has the patient been seen for an appointment in the last year OR does the patient have an upcoming appointment? Yes  Can we respond through MyChart? No - Phone Call  Agent: Please be advised that Rx refills may take up to 3 business days. We ask that you follow-up with your pharmacy.

## 2023-11-22 NOTE — Telephone Encounter (Signed)
 Last Fill: Breztri: 11/19/22     Singulair: 09/20/23 90 tabs/0 RF  Last OV: 09/07/23 Next OV: None Scheduled  Routing to provider for review/authorization.

## 2023-12-17 ENCOUNTER — Other Ambulatory Visit: Payer: Self-pay | Admitting: Internal Medicine

## 2024-02-06 ENCOUNTER — Ambulatory Visit
Admission: RE | Admit: 2024-02-06 | Discharge: 2024-02-06 | Disposition: A | Discharge status: Home / Self Care | Source: Ambulatory Visit | Attending: Internal Medicine | Admitting: Internal Medicine

## 2024-02-06 VITALS — BP 159/92 | HR 71 | Temp 98.7°F | Resp 16

## 2024-02-06 DIAGNOSIS — K047 Periapical abscess without sinus: Secondary | ICD-10-CM | POA: Diagnosis not present

## 2024-02-06 DIAGNOSIS — R220 Localized swelling, mass and lump, head: Secondary | ICD-10-CM | POA: Diagnosis not present

## 2024-02-06 MED ORDER — CEFTRIAXONE SODIUM 500 MG IJ SOLR
500.0000 mg | INTRAMUSCULAR | Status: DC
  Administered 2024-02-06: 500 mg via INTRAMUSCULAR

## 2024-02-06 MED ORDER — DEXAMETHASONE SODIUM PHOSPHATE 10 MG/ML IJ SOLN
10.0000 mg | Freq: Once | INTRAMUSCULAR | Status: AC
  Administered 2024-02-06: 10 mg via INTRAMUSCULAR

## 2024-02-06 MED ORDER — KETOROLAC TROMETHAMINE 30 MG/ML IJ SOLN
15.0000 mg | Freq: Once | INTRAMUSCULAR | Status: AC
  Administered 2024-02-06: 15 mg via INTRAMUSCULAR

## 2024-02-06 MED ORDER — HYDROCODONE-ACETAMINOPHEN 5-325 MG PO TABS: 1.0000 | ORAL_TABLET | Freq: Four times a day (QID) | ORAL | 0 refills | Status: AC | PRN

## 2024-02-06 NOTE — Discharge Instructions (Addendum)
 Right lower jaw dental infection with significant pain and swelling.  We will treat this with injectable antibiotics, medication to help with pain and steroids due to the significant swelling.  Will have you keep the appointment with your dentist tomorrow at 9:30 AM.  If the swelling in the face worsens then may need to proceed to the emergency room for further evaluation. Toradol injection given today. This is a medication to help with pain. This is not a narcotic.  Decadron injection given today. This is a steroid to help with inflammation, swelling and pain. Rocephin injection given today.  This is an antibiotic. Hydrocodone /Tylenol  1 to 2 tablets every 6 hours as needed for severe pain.  Use caution as this medication can make you very drowsy.  Do not drive while taking this medication.  Do not take Tylenol  containing products while you are on this medication. Make sure to keep your follow-up appointment as scheduled with your dentist tomorrow If you feel any difficulty breathing or worsening of your symptoms then recommend going to the emergency room tonight Return to urgent care as needed

## 2024-02-06 NOTE — ED Provider Notes (Signed)
 [facility]    CSN: [id number] Arrival date & time: 02/06/24  1137      History   Chief Complaint Chief Complaint  Patient presents with   Dental Problem    Pain and swelling - Entered by patient    HPI [name]. is a 69 y.o. male.   68 year old male who presents urgent care with complaints of right jaw and neck swelling and significant pain from dental infection.  The patient reports that he contacted his dentist to schedule an appointment due to increasing pain in the right lower jaw.  The next appointment they had available was tomorrow at 9:30 in the morning.  His symptoms got much worse today and he did try to contact his dentist but they were not there.  He has had an increase in swelling around the face, jaw and neck.  The pain is severe and he was not sure that he could make it until tomorrow without some relief.  He denies any fevers, chills, shortness of breath, difficulty breathing, difficulty swallowing.     Past Medical History:  Diagnosis Date   COPD (chronic obstructive pulmonary disease) with emphysema (HCC)    Esophageal reflux    Exertional shortness of breath    just today (09/27/2013)   Hypertension     Patient Active Problem List   Diagnosis Date Noted   Acute respiratory failure with hypoxia (HCC) 03/24/2022   HLD (hyperlipidemia) 03/24/2022   Hyperglycemia 03/24/2022   Tobacco abuse 03/24/2022   COPD exacerbation (HCC) 03/24/2022   Influenza A 06/23/2021   Former cigarette smoker 10/02/2020   Obesity (BMI 30-39.9) 12/27/2016   Tachycardia 09/27/2013   GERD (gastroesophageal reflux disease) 09/27/2013   Essential hypertension 09/27/2013   COPD with acute exacerbation (HCC) 09/26/2013   COPD GOLD 2 10/25/2007    Past Surgical History:  Procedure Laterality Date   INGUINAL HERNIA REPAIR Left 2000's       Home Medications    Prior to Admission medications   Medication Sig Start Date End Date Taking? Authorizing  Provider  HYDROcodone -acetaminophen  (NORCO/VICODIN) 5-325 MG tablet Take 1-2 tablets by mouth every 6 (six) hours as needed for severe pain (pain score 7-10). 02/06/24  Yes Juan Olthoff A, PA-C  albuterol  (PROAIR  HFA) 108 (90 Base) MCG/ACT inhaler 2 puffs every 4 hours as needed only  if your can't catch your breath 10/15/21   Darlean Ozell NOVAK, MD  albuterol  (PROVENTIL ) (2.5 MG/3ML) 0.083% nebulizer solution Take 3 mLs (2.5 mg total) by nebulization every 6 (six) hours as needed for wheezing or shortness of breath. 03/17/22   Ruthell Lauraine FALCON, NP  budeson-glycopyrrolate-formoterol  (BREZTRI  AEROSPHERE) 160-9-4.8 MCG/ACT AERO Inhale 2 puffs into the lungs in the morning and at bedtime. 11/22/23   Darlean Ozell NOVAK, MD  fish oil-omega-3 fatty acids  1000 MG capsule Take 1 g by mouth daily.    [provider]  irbesartan  (AVAPRO ) 150 MG tablet Take 1 tablet (150 mg total) by mouth daily. 12/25/16   Darlean Ozell NOVAK, MD  montelukast  (SINGULAIR ) 10 MG tablet TAKE 1 TABLET BY MOUTH AT BEDTIME 12/17/23   Wert, Michael B, MD  Multiple Vitamin (MULTIVITAMIN WITH MINERALS) TABS tablet Take 1 tablet by mouth daily.    [provider]  pantoprazole  (PROTONIX ) 40 MG tablet Take 40 mg by mouth daily. 02/19/22   [provider]  Turmeric 500 MG CAPS Take 1 tablet by mouth daily.    [provider]    [facility]  History No family history on file.  Social History Social History   Tobacco Use   Smoking status: Former    Current packs/day: 0.00    Average packs/day: 1 pack/day for 30.0 years (30.0 ttl pk-yrs)    Types: Cigarettes    Start date: 05/17/1977    Quit date: 05/18/2007    Years since quitting: 16.7   Smokeless tobacco: Current  Vaping Use   Vaping status: Never Used  Substance Use Topics   Alcohol use: Not Currently   Drug use: No     Allergies   Hydrochlorothiazide   Review of Systems Review of Systems  Constitutional:  Negative for chills and fever.  HENT:  Negative  for ear pain, sore throat and trouble swallowing.   Eyes:  Negative for pain and visual disturbance.  Respiratory:  Negative for cough, shortness of breath and stridor.   Cardiovascular:  Negative for chest pain and palpitations.  Gastrointestinal:  Negative for abdominal pain and vomiting.  Genitourinary:  Negative for dysuria and hematuria.  Musculoskeletal:  Negative for arthralgias and back pain.  Skin:  Negative for color change and rash.  Neurological:  Negative for seizures and syncope.  All other systems reviewed and are negative.    Physical Exam Triage Vital Signs ED Triage Vitals  Encounter Vitals Group     BP 02/06/24 1215 (!) 159/92     Girls Systolic BP Percentile --      Girls Diastolic BP Percentile --      Boys Systolic BP Percentile --      Boys Diastolic BP Percentile --      Pulse Rate 02/06/24 1215 71     Resp 02/06/24 1215 16     Temp 02/06/24 1215 98.7 F (37.1 C)     Temp Source 02/06/24 1215 Oral     SpO2 02/06/24 1215 93 %     Weight --      Height --      Head Circumference --      Peak Flow --      Pain Score 02/06/24 1212 8     Pain Loc --      Pain Education --      Exclude from Growth Chart --    No data found.  Updated Vital Signs BP (!) 159/92 (BP Location: Left Arm)   Pulse 71   Temp 98.7 F (37.1 C) (Oral)   Resp 16   SpO2 93%   Visual Acuity Right Eye Distance:   Left Eye Distance:   Bilateral Distance:    Right Eye Near:   Left Eye Near:    Bilateral Near:     Physical Exam Vitals and nursing note reviewed.  Constitutional:      General: He is not in acute distress.    Appearance: He is well-developed.  HENT:     Head: Normocephalic and atraumatic.      Mouth/Throat:    Eyes:     Conjunctiva/sclera: Conjunctivae normal.    Cardiovascular:     Rate and Rhythm: Normal rate and regular rhythm.     Heart sounds: No murmur heard. Pulmonary:     Effort: Pulmonary effort is normal. No respiratory distress.      Breath sounds: Normal breath sounds.  Abdominal:     Palpations: Abdomen is soft.     Tenderness: There is no abdominal tenderness.   Musculoskeletal:        General: No swelling.     Cervical back:  Neck supple.   Skin:    General: Skin is warm and dry.     Capillary Refill: Capillary refill takes less than 2 seconds.   Neurological:     Mental Status: He is alert.   Psychiatric:        Mood and Affect: Mood normal.      UC Treatments / Results  Labs (all labs ordered are listed, but only abnormal results are displayed) Labs Reviewed - No data to display  EKG   Radiology No results found.  Procedures Procedures (including critical care time)  Medications Ordered in UC Medications  ketorolac (TORADOL) 30 MG/ML injection 15 mg (has no administration in time range)  dexamethasone (DECADRON) injection 10 mg (has no administration in time range)  cefTRIAXone (ROCEPHIN) injection 500 mg (has no administration in time range)    Initial Impression / Assessment and Plan / UC Course  I have reviewed the triage vital signs and the nursing notes.  Pertinent labs & imaging results that were available during my care of the patient were reviewed by me and considered in my medical decision making (see chart for details).     Dental infection  Jaw swelling   Right lower jaw dental infection with significant pain and swelling.  We will treat this with injectable antibiotics, medication to help with pain and steroids due to the significant swelling.  Will have you keep the appointment with your dentist tomorrow at 9:30 AM.  If the swelling in the face worsens then may need to proceed to the emergency room for further evaluation. Toradol injection given today. This is a medication to help with pain. This is not a narcotic.  Decadron injection given today. This is a steroid to help with inflammation, swelling and pain. Rocephin injection given today.  This is an  antibiotic. Hydrocodone /Tylenol  1 to 2 tablets every 6 hours as needed for severe pain.  Use caution as this medication can make you very drowsy.  Do not drive while taking this medication.  Do not take Tylenol  containing products while you are on this medication. Make sure to keep your follow-up appointment as scheduled with your dentist tomorrow If you feel any difficulty breathing or worsening of your symptoms then recommend going to the emergency room tonight Return to urgent care as needed  Final Clinical Impressions(s) / UC Diagnoses   Final diagnoses:  Dental infection  Jaw swelling     Discharge Instructions      Right lower jaw dental infection with significant pain and swelling.  We will treat this with injectable antibiotics, medication to help with pain and steroids due to the significant swelling.  Will have you keep the appointment with your dentist tomorrow at 9:30 AM.  If the swelling in the face worsens then may need to proceed to the emergency room for further evaluation. Toradol injection given today. This is a medication to help with pain. This is not a narcotic.  Decadron injection given today. This is a steroid to help with inflammation, swelling and pain. Rocephin injection given today.  This is an antibiotic. Hydrocodone /Tylenol  1 to 2 tablets every 6 hours as needed for severe pain.  Use caution as this medication can make you very drowsy.  Do not drive while taking this medication.  Do not take Tylenol  containing products while you are on this medication. Make sure to keep your follow-up appointment as scheduled with your dentist tomorrow If you feel any difficulty breathing or worsening of your symptoms  then recommend going to the emergency room tonight Return to urgent care as needed    ED Prescriptions     Medication Sig Dispense Auth. Provider   HYDROcodone -acetaminophen  (NORCO/VICODIN) 5-325 MG tablet Take 1-2 tablets by mouth every 6 (six) hours as  needed for severe pain (pain score 7-10). 10 tablet Teresa Almarie LABOR, [place]      I have reviewed the PDMP during this encounter.   Teresa Almarie LABOR, PA-C 02/06/24 1249

## 2024-02-06 NOTE — ED Triage Notes (Signed)
 Pt c/o right lower pain/swelling x 5 days-has appt with dentist 930am-NAD-steady gait

## 2024-03-14 ENCOUNTER — Other Ambulatory Visit: Payer: Self-pay | Admitting: Internal Medicine

## 2024-03-24 ENCOUNTER — Ambulatory Visit (HOSPITAL_COMMUNITY): Admission: EM | Admit: 2024-03-24 | Discharge: 2024-03-24 | Disposition: A | Discharge status: Home / Self Care

## 2024-03-24 ENCOUNTER — Other Ambulatory Visit: Payer: Self-pay

## 2024-03-24 ENCOUNTER — Emergency Department (HOSPITAL_COMMUNITY)

## 2024-03-24 ENCOUNTER — Emergency Department (HOSPITAL_COMMUNITY)
Admission: EM | Admit: 2024-03-24 | Discharge: 2024-03-24 | Disposition: A | Discharge status: Home / Self Care | Source: Ambulatory Visit | Attending: Emergency Medicine | Admitting: Emergency Medicine

## 2024-03-24 ENCOUNTER — Encounter (HOSPITAL_COMMUNITY): Payer: Self-pay | Admitting: Radiology

## 2024-03-24 ENCOUNTER — Encounter (HOSPITAL_COMMUNITY): Payer: Self-pay | Admitting: Emergency Medicine

## 2024-03-24 DIAGNOSIS — M542 Cervicalgia: Secondary | ICD-10-CM | POA: Insufficient documentation

## 2024-03-24 DIAGNOSIS — R221 Localized swelling, mass and lump, neck: Secondary | ICD-10-CM | POA: Diagnosis not present

## 2024-03-24 DIAGNOSIS — M272 Inflammatory conditions of jaws: Secondary | ICD-10-CM

## 2024-03-24 DIAGNOSIS — M799 Soft tissue disorder, unspecified: Secondary | ICD-10-CM | POA: Diagnosis not present

## 2024-03-24 DIAGNOSIS — R590 Localized enlarged lymph nodes: Secondary | ICD-10-CM | POA: Diagnosis not present

## 2024-03-24 DIAGNOSIS — R220 Localized swelling, mass and lump, head: Secondary | ICD-10-CM | POA: Diagnosis not present

## 2024-03-24 DIAGNOSIS — I6521 Occlusion and stenosis of right carotid artery: Secondary | ICD-10-CM | POA: Diagnosis not present

## 2024-03-24 DIAGNOSIS — J449 Chronic obstructive pulmonary disease, unspecified: Secondary | ICD-10-CM | POA: Diagnosis not present

## 2024-03-24 DIAGNOSIS — R6884 Jaw pain: Secondary | ICD-10-CM | POA: Diagnosis not present

## 2024-03-24 DIAGNOSIS — K122 Cellulitis and abscess of mouth: Secondary | ICD-10-CM | POA: Diagnosis not present

## 2024-03-24 DIAGNOSIS — L0291 Cutaneous abscess, unspecified: Secondary | ICD-10-CM | POA: Diagnosis not present

## 2024-03-24 LAB — COMPREHENSIVE METABOLIC PANEL WITH GFR
ALT: 16 U/L (ref 0–44)
AST: 17 U/L (ref 15–41)
Albumin: 3.7 g/dL (ref 3.5–5.0)
Alkaline Phosphatase: 72 U/L (ref 38–126)
Anion gap: 10 (ref 5–15)
BUN: 15 mg/dL (ref 8–23)
CO2: 22 mmol/L (ref 22–32)
Calcium: 9.1 mg/dL (ref 8.9–10.3)
Chloride: 108 mmol/L (ref 98–111)
Creatinine, Ser: 0.85 mg/dL (ref 0.61–1.24)
GFR, Estimated: >60 mL/min (ref >60)
Glucose, Bld: 82 mg/dL (ref 70–99)
Potassium: 4 mmol/L (ref 3.5–5.1)
Sodium: 140 mmol/L (ref 135–145)
Total Bilirubin: 0.8 mg/dL (ref 0.0–1.2)
Total Protein: 7.4 g/dL (ref 6.5–8.1)

## 2024-03-24 LAB — CBC WITH DIFFERENTIAL/PLATELET
Abs Immature Granulocytes: 0.07 K/uL (ref 0.00–0.07)
Basophils Absolute: 0.1 K/uL (ref 0.0–0.1)
Basophils Relative: 0 %
Eosinophils Absolute: 0.2 K/uL (ref 0.0–0.5)
Eosinophils Relative: 1 %
HCT: 44.4 % (ref 39.0–52.0)
Hemoglobin: 15.1 g/dL (ref 13.0–17.0)
Immature Granulocytes: 1 %
Lymphocytes Relative: 12 %
Lymphs Abs: 1.4 K/uL (ref 0.7–4.0)
MCH: 29.7 pg (ref 26.0–34.0)
MCHC: 34 g/dL (ref 30.0–36.0)
MCV: 87.4 fL (ref 80.0–100.0)
Monocytes Absolute: 1.4 K/uL — ABNORMAL HIGH (ref 0.1–1.0)
Monocytes Relative: 12 %
Neutro Abs: 8.9 K/uL — ABNORMAL HIGH (ref 1.7–7.7)
Neutrophils Relative %: 74 %
Platelets: 217 K/uL (ref 150–400)
RBC: 5.08 MIL/uL (ref 4.22–5.81)
RDW: 14 % (ref 11.5–15.5)
WBC: 12 K/uL — ABNORMAL HIGH (ref 4.0–10.5)
nRBC: 0 % (ref 0.0–0.2)

## 2024-03-24 LAB — I-STAT CG4 LACTIC ACID, ED: Lactic Acid, Venous: 0.8 mmol/L (ref 0.5–1.9)

## 2024-03-24 MED ORDER — IOHEXOL 300 MG/ML  SOLN
75.0000 mL | Freq: Once | INTRAMUSCULAR | Status: AC | PRN
  Administered 2024-03-24: 75 mL via INTRAVENOUS

## 2024-03-24 MED ORDER — METRONIDAZOLE 500 MG PO TABS: 500.0000 mg | ORAL_TABLET | Freq: Two times a day (BID) | ORAL | 0 refills | Status: AC

## 2024-03-24 MED ORDER — OXYCODONE-ACETAMINOPHEN 5-325 MG PO TABS: 1.0000 | ORAL_TABLET | Freq: Four times a day (QID) | ORAL | 0 refills | Status: AC | PRN

## 2024-03-24 MED ORDER — AMOXICILLIN-POT CLAVULANATE 875-125 MG PO TABS: 1.0000 | ORAL_TABLET | Freq: Two times a day (BID) | ORAL | 0 refills | Status: AC

## 2024-03-24 MED ORDER — SODIUM CHLORIDE 0.9 % IV SOLN
3.0000 g | Freq: Once | INTRAVENOUS | Status: AC
  Administered 2024-03-24: 3 g via INTRAVENOUS
  Filled 2024-03-24: qty 8

## 2024-03-24 NOTE — ED Provider Notes (Signed)
 [place] [facility] Provider Note   CSN: [id number] Arrival date & time: 03/24/24  1417     Patient presents with: Oral Swelling   [name]. is a 68 y.o. male.   HPI     Patient comes in with chief complaint of jaw pain, neck pain and swelling.  Patient has previous history of COPD, flu.  He states that about 2 weeks ago he was seen by Dr. Lonni Chester, dentist for multiple teeth extraction.  He had some swelling thereafter, and he was started on amoxicillin .  He finished the antibiotic course about a week ago.  Since then, there has been some swelling to his face, but in the last 24 hours, the swelling has worsened rapidly and greatly.  Patient has no trouble breathing, but he does feel little uncomfortable with swallowing and according to the wife, there is subtle change in his voice.  He called his dentist, advised that he come to the emergency room.  Prior to Admission medications   Medication Sig Start Date End Date Taking? Authorizing Provider  albuterol  (PROAIR  HFA) 108 (90 Base) MCG/ACT inhaler 2 puffs every 4 hours as needed only  if your can't catch your breath 10/15/21   Darlean Ozell NOVAK, MD  albuterol  (PROVENTIL ) (2.5 MG/3ML) 0.083% nebulizer solution Take 3 mLs (2.5 mg total) by nebulization every 6 (six) hours as needed for wheezing or shortness of breath. 03/17/22   Ruthell Lauraine FALCON, NP  budeson-glycopyrrolate-formoterol  (BREZTRI  AEROSPHERE) 160-9-4.8 MCG/ACT AERO Inhale 2 puffs into the lungs in the morning and at bedtime. 11/22/23   Darlean Ozell NOVAK, MD  fish oil-omega-3 fatty acids  1000 MG capsule Take 1 g by mouth daily.    [provider]  HYDROcodone -acetaminophen  (NORCO/VICODIN) 5-325 MG tablet Take 1-2 tablets by mouth every 6 (six) hours as needed for severe pain (pain score 7-10). 02/06/24   Teresa Almarie LABOR, PA-C  irbesartan  (AVAPRO ) 150 MG tablet Take 1 tablet (150 mg total) by mouth daily. 12/25/16   Darlean Ozell NOVAK, MD  montelukast  (SINGULAIR ) 10 MG tablet TAKE 1 TABLET BY MOUTH AT BEDTIME 03/14/24   Wert, Michael B, MD  Multiple Vitamin (MULTIVITAMIN WITH MINERALS) TABS tablet Take 1 tablet by mouth daily.    [provider]  pantoprazole  (PROTONIX ) 40 MG tablet Take 40 mg by mouth daily. 02/19/22   [provider]  Turmeric 500 MG CAPS Take 1 tablet by mouth daily.    [provider]    Allergies: Hydrochlorothiazide    Review of Systems  All other systems reviewed and are negative.   Updated Vital Signs BP (!) 144/108   Pulse 70   Temp 97.9 F (36.6 C) (Oral)   Resp 17   SpO2 96%   Physical Exam Vitals and nursing note reviewed.  Constitutional:      Appearance: He is well-developed.  HENT:     Head: Atraumatic.     Mouth/Throat:     Comments: Right-sided neck swelling, with indurated lesion in the mandibular region, no trismus.   Cardiovascular:     Rate and Rhythm: Normal rate.  Pulmonary:     Effort: Pulmonary effort is normal.  Musculoskeletal:     Cervical back: Neck supple.  Skin:    General: Skin is warm.  Neurological:     Mental Status: He is alert and oriented to person, place, and time.     (all labs ordered are listed, but only abnormal results  are displayed) Labs Reviewed  CBC WITH DIFFERENTIAL/PLATELET - Abnormal; Notable for the following components:      Result Value   WBC 12.0 (*)    Neutro Abs 8.9 (*)    Monocytes Absolute 1.4 (*)    All other components within normal limits  COMPREHENSIVE METABOLIC PANEL WITH GFR  I-STAT CG4 LACTIC ACID, ED    EKG: None  Radiology: No results found.   Procedures   Medications Ordered in the ED  Ampicillin -Sulbactam (UNASYN ) 3 g in sodium chloride  0.9 % 100 mL IVPB (3 g Intravenous New Bag/Given 03/24/24 1532)    Clinical Course as of 03/24/24 1539  Fri Mar 24, 2024  1518 Signout; recent dental extractions with new swelling s/p antibiotics. Pending CT scans.  [TY]     Clinical Course User Index [TY] Neysa Caron PARAS, DO                                 Medical Decision Making Amount and/or Complexity of Data Reviewed Labs: ordered. Radiology: ordered.   68 year old male with history of COPD, recent dental procedure comes in with chief complaint of swelling to the mouth.  He had dental extraction done 2 weeks ago.  He was on amoxicillin .  In the last few days, the swelling has reappeared, and it progressively worsened in the last 24 hours.  Differential diagnosis for him would include Ludwig's angina, deep space infection including abscess, sialadenitis   And dental abscess.  Tumor also possible, but thought to be less likely.  Plan is to get basic labs, CT soft tissue neck with contrast and reassess. Patient's care has been signed out to incoming team   Final diagnoses:  None    ED Discharge Orders     None          Charlyn Sora, MD 03/24/24 [phone]

## 2024-03-24 NOTE — ED Provider Notes (Signed)
 Received signout.  See prior team's note for full HPI.  Clinical Course as of 03/24/24 1836  [place] Mar 24, 2024  1518 Signout; recent dental extractions with new swelling s/p antibiotics. Pending CT scans.  [TY]  1649 CT Soft Tissue Neck W Contrast IMPRESSION: 1. 1.5 cm abscess in the subcutaneous tissues overlying the right aspect of the mandible. 2. Surrounding soft tissue swelling and stranding extending into the right anterior neck and associated skin thickening. Findings suggestive of cellulitis. 3. Irregularity of the underlying mandible with cortical erosion, possibly reflecting osteomyelitis. Consider MRI neck with and without contrast for further evaluation. 4. No mass, mass effect, or enhancement along the aerodigestive structures in the neck. 5. Multiple mildly prominent right-sided cervical lymph nodes, likely reactive in the setting of infection.  Electronically signed by: Donnice Mania MD 03/24/2024 04:47 PM EDT RP Workstation: [id number]   [TY]  1726 Discussed with [facility] transfer center.  They do not have oral facial/maxillary surgery at this time.  Will reach out to [facility] [TY]  8165 Discussed with [facility] orofacial maxillary surgeon Dr. Jake, after  their discussion of presentation today, labs, vitals and his review of images does not think patient needs admission at this point and would place on Augmentin  and Flagyl  and he can  see patient first thing Monday morning for surgery. [TY]  1835 Patient was reevaluated, he reports some minor improvement after the IV antibiotics, continues to handle secretions, no difficulty swallowing or shortness of breath.  Discussed antibiotics and close outpatient follow-up with the surgeon on Monday morning for procedure.  He feels comfortable going home and return promptly here or go to [facility] if symptoms worsen.  Will discharge in stable condition at this time. [TY]    Clinical Course User Index [TY] Neysa Caron PARAS, DO      Neysa Caron PARAS, [place] 03/24/24 [phone]

## 2024-03-24 NOTE — ED Provider Notes (Signed)
 [facility]    CSN: [id number] Arrival date & time: 03/24/24  1251     History   Chief Complaint Chief Complaint  Patient presents with   Oral Swelling    HPI [name]. is a 68 y.o. male.  Here with right lower dental pain, facial swelling, discomfort swallowing   Seen 6/22 for swelling and was treated with IM rocephin  dose. He had dental visit the next day. Reports having tooth extracted. He finished two oral antibiotic course about 2-3 weeks ago.  Swelling got worse yesterday. Changed overnight. He now feels the swelling under his jaw, over his throat. Feeling discomfort with swallowing. Wife reports his voice sounds different today  Has not had fever  Past Medical History:  Diagnosis Date   COPD (chronic obstructive pulmonary disease) with emphysema (HCC)    Esophageal reflux    Exertional shortness of breath    just today (09/27/2013)   Hypertension     Patient Active Problem List   Diagnosis Date Noted   Acute respiratory failure with hypoxia (HCC) 03/24/2022   HLD (hyperlipidemia) 03/24/2022   Hyperglycemia 03/24/2022   Tobacco abuse 03/24/2022   COPD exacerbation (HCC) 03/24/2022   Influenza A 06/23/2021   Former cigarette smoker 10/02/2020   Obesity (BMI 30-39.9) 12/27/2016   Tachycardia 09/27/2013   GERD (gastroesophageal reflux disease) 09/27/2013   Essential hypertension 09/27/2013   COPD with acute exacerbation (HCC) 09/26/2013   COPD GOLD 2 10/25/2007    Past Surgical History:  Procedure Laterality Date   INGUINAL HERNIA REPAIR Left 04/18/1999   TOOTH EXTRACTION         Home Medications    Prior to Admission medications   Medication Sig Start Date End Date Taking? Authorizing Provider  albuterol  (PROAIR  HFA) 108 (90 Base) MCG/ACT inhaler 2 puffs every 4 hours as needed only  if your can't catch your breath 10/15/21   Darlean Ozell NOVAK, MD  albuterol  (PROVENTIL ) (2.5 MG/3ML) 0.083% nebulizer solution Take 3 mLs (2.5 mg  total) by nebulization every 6 (six) hours as needed for wheezing or shortness of breath. 03/17/22   Ruthell Lauraine FALCON, NP  budeson-glycopyrrolate-formoterol  (BREZTRI  AEROSPHERE) 160-9-4.8 MCG/ACT AERO Inhale 2 puffs into the lungs in the morning and at bedtime. 11/22/23   Darlean Ozell NOVAK, MD  fish oil-omega-3 fatty acids  1000 MG capsule Take 1 g by mouth daily.    [provider]  HYDROcodone -acetaminophen  (NORCO/VICODIN) 5-325 MG tablet Take 1-2 tablets by mouth every 6 (six) hours as needed for severe pain (pain score 7-10). 02/06/24   White, Elizabeth A, PA-C  irbesartan  (AVAPRO ) 150 MG tablet Take 1 tablet (150 mg total) by mouth daily. 12/25/16   Darlean Ozell NOVAK, MD  montelukast  (SINGULAIR ) 10 MG tablet TAKE 1 TABLET BY MOUTH AT BEDTIME 03/14/24   Wert, Michael B, MD  Multiple Vitamin (MULTIVITAMIN WITH MINERALS) TABS tablet Take 1 tablet by mouth daily.    [provider]  pantoprazole  (PROTONIX ) 40 MG tablet Take 40 mg by mouth daily. 02/19/22   [provider]  Turmeric 500 MG CAPS Take 1 tablet by mouth daily.    [provider]    Family History No family history on file.  Social History Social History   Tobacco Use   Smoking status: Former    Current packs/day: 0.00    Average packs/day: 1 pack/day for 30.0 years (30.0 ttl pk-yrs)    Types: Cigarettes    Start date: 05/17/1977    Quit  date: 05/18/2007    Years since quitting: 16.8   Smokeless tobacco: Current  Vaping Use   Vaping status: Never Used  Substance Use Topics   Alcohol use: Not Currently   Drug use: No     Allergies   Hydrochlorothiazide   Review of Systems Review of Systems As per HPI  Physical Exam Triage Vital Signs ED Triage Vitals  Encounter Vitals Group     BP 03/24/24 1318 130/84     Girls Systolic BP Percentile --      Girls Diastolic BP Percentile --      Boys Systolic BP Percentile --      Boys Diastolic BP Percentile --      Pulse Rate 03/24/24 1318 74      Resp 03/24/24 1318 17     Temp 03/24/24 1318 98.3 F (36.8 C)     Temp Source 03/24/24 1318 Oral     SpO2 03/24/24 1318 96 %     Weight --      Height --      Head Circumference --      Peak Flow --      Pain Score 03/24/24 1316 0     Pain Loc --      Pain Education --      Exclude from Growth Chart --    No data found.  Updated Vital Signs BP 130/84 (BP Location: Right Arm)   Pulse 74   Temp 98.3 F (36.8 C) (Oral)   Resp 17   SpO2 96%    Physical Exam Vitals and nursing note reviewed.  Constitutional:      General: He is not in acute distress.    Appearance: Normal appearance.  HENT:     Head:     Jaw: Tenderness and swelling present.      Comments: Right side facial swelling. There is swelling under the chin, over the anterior throat. Tender to touch. There is no tenderness of oral cavity floor. Per wife phonation is abnormal. He is tolerating secretions     Mouth/Throat:     Dentition: Abnormal dentition.     Pharynx: Oropharynx is clear.  Cardiovascular:     Rate and Rhythm: Normal rate and regular rhythm.     Pulses: Normal pulses.     Heart sounds: Normal heart sounds.  Pulmonary:     Effort: Pulmonary effort is normal.     Breath sounds: Normal breath sounds.  Abdominal:     Palpations: Abdomen is soft.  Neurological:     Mental Status: He is alert and oriented to person, place, and time.      UC Treatments / Results  Labs (all labs ordered are listed, but only abnormal results are displayed) Labs Reviewed - No data to display  EKG   Radiology No results found.  Procedures Procedures (including critical care time)  Medications Ordered in UC Medications - No data to display  Initial Impression / Assessment and Plan / UC Course  I have reviewed the triage vital signs and the nursing notes.  Pertinent labs & imaging results that were available during my care of the patient were reviewed by me and considered in my medical decision making  (see chart for details).  Afebrile in clinic  With onset swelling anterior throat, dysphagia, and phonation change, I have recommended evaluation in the emergency department. Requires higher level of care, advanced imaging not available in urgent care setting. CT neck soft tissue to rule out ludwigs  Final Clinical Impressions(s) / UC Diagnoses   Final diagnoses:  Facial swelling  Neck swelling     Discharge Instructions      Please go to the emergency department for imaging.  I am concerned about a deep tissue neck infection that would require a CT -- which is not available in the urgent care      ED Prescriptions   None    PDMP not reviewed this encounter.   Deya Bigos, [place], [place] 03/24/24 1409

## 2024-03-24 NOTE — Discharge Instructions (Addendum)
 Please go to the emergency department for imaging.  I am concerned about a deep tissue neck infection that would require a CT -- which is not available in the urgent care

## 2024-03-24 NOTE — ED Triage Notes (Signed)
 Pt has right side jaw swelling, that is progressing to mid neck. Pt denies difficulty breathing, but is having some difficulty swallowing. Pt had a a tooth pulled 2 weeks ago, has already had 2 rounds of amoxicillin , was sent her by UC for a CT.

## 2024-03-24 NOTE — ED Triage Notes (Signed)
 Pt reports that he had right lower tooth extracted about month ago and had some swelling. Pt completed the antibiotics that was prescribed for the swelling.  Pt reports swelling got worse yesterday and today swelling spreading towards mid throat. Pain when touches.  Pt reports now can feel the swelling when swallows that used to not be able to.  Called Dentist who isnt in today.

## 2024-03-24 NOTE — Discharge Instructions (Addendum)
 Appointment Monday at 8:30am with Dr. Jake at [facility].   Cecily Hurst, First Floor [address] [place], [place] [address]  Parking Information  Phone [phone]

## 2024-03-27 DIAGNOSIS — K122 Cellulitis and abscess of mouth: Secondary | ICD-10-CM | POA: Diagnosis not present

## 2024-05-20 ENCOUNTER — Other Ambulatory Visit: Payer: Self-pay | Admitting: Internal Medicine

## 2024-05-21 ENCOUNTER — Other Ambulatory Visit: Payer: Self-pay | Admitting: Internal Medicine

## 2024-09-03 ENCOUNTER — Other Ambulatory Visit: Payer: Self-pay | Admitting: Internal Medicine

## 2024-09-07 ENCOUNTER — Other Ambulatory Visit: Payer: Self-pay | Admitting: Internal Medicine

## 2024-09-12 ENCOUNTER — Other Ambulatory Visit: Payer: Self-pay | Admitting: Internal Medicine

## 2024-09-25 ENCOUNTER — Ambulatory Visit: Admitting: Internal Medicine

## 2024-09-25 DIAGNOSIS — J449 Chronic obstructive pulmonary disease, unspecified: Secondary | ICD-10-CM
